# Patient Record
Sex: Female | Born: 1940
Health system: Southern US, Community
[De-identification: ages and names within clinical notes are randomized; demographics above are authoritative.]

## PROBLEM LIST (undated history)

## (undated) DIAGNOSIS — F419 Anxiety disorder, unspecified: Secondary | ICD-10-CM

## (undated) DIAGNOSIS — E7800 Pure hypercholesterolemia, unspecified: Secondary | ICD-10-CM

## (undated) DIAGNOSIS — F32A Depression, unspecified: Secondary | ICD-10-CM

## (undated) DIAGNOSIS — F329 Major depressive disorder, single episode, unspecified: Secondary | ICD-10-CM

## (undated) HISTORY — PX: OTHER SURGICAL HISTORY: SHX169

## (undated) HISTORY — DX: Pure hypercholesterolemia, unspecified: E78.00

## (undated) HISTORY — DX: Anxiety disorder, unspecified: F41.9

## (undated) HISTORY — PX: CHOLECYSTECTOMY: SHX55

## (undated) HISTORY — DX: Depression, unspecified: F32.A

## (undated) HISTORY — DX: Major depressive disorder, single episode, unspecified: F32.9

---

## 2008-07-27 HISTORY — PX: COLONOSCOPY: SHX174

## 2009-08-15 ENCOUNTER — Ambulatory Visit: Payer: Self-pay | Admitting: Cardiology

## 2014-08-13 DIAGNOSIS — N3000 Acute cystitis without hematuria: Secondary | ICD-10-CM | POA: Diagnosis not present

## 2014-08-13 DIAGNOSIS — R109 Unspecified abdominal pain: Secondary | ICD-10-CM | POA: Diagnosis not present

## 2014-08-13 DIAGNOSIS — E780 Pure hypercholesterolemia: Secondary | ICD-10-CM | POA: Diagnosis not present

## 2014-08-13 DIAGNOSIS — F419 Anxiety disorder, unspecified: Secondary | ICD-10-CM | POA: Diagnosis not present

## 2014-09-04 DIAGNOSIS — Z1231 Encounter for screening mammogram for malignant neoplasm of breast: Secondary | ICD-10-CM | POA: Diagnosis not present

## 2014-11-14 DIAGNOSIS — Z1389 Encounter for screening for other disorder: Secondary | ICD-10-CM | POA: Diagnosis not present

## 2014-11-14 DIAGNOSIS — Z23 Encounter for immunization: Secondary | ICD-10-CM | POA: Diagnosis not present

## 2014-11-14 DIAGNOSIS — F419 Anxiety disorder, unspecified: Secondary | ICD-10-CM | POA: Diagnosis not present

## 2014-11-14 DIAGNOSIS — R05 Cough: Secondary | ICD-10-CM | POA: Diagnosis not present

## 2014-11-14 DIAGNOSIS — E780 Pure hypercholesterolemia: Secondary | ICD-10-CM | POA: Diagnosis not present

## 2014-11-14 DIAGNOSIS — F328 Other depressive episodes: Secondary | ICD-10-CM | POA: Diagnosis not present

## 2014-11-14 DIAGNOSIS — Z0001 Encounter for general adult medical examination with abnormal findings: Secondary | ICD-10-CM | POA: Diagnosis not present

## 2014-11-14 DIAGNOSIS — J309 Allergic rhinitis, unspecified: Secondary | ICD-10-CM | POA: Diagnosis not present

## 2015-05-13 DIAGNOSIS — R05 Cough: Secondary | ICD-10-CM | POA: Diagnosis not present

## 2015-05-13 DIAGNOSIS — Z23 Encounter for immunization: Secondary | ICD-10-CM | POA: Diagnosis not present

## 2015-05-13 DIAGNOSIS — J309 Allergic rhinitis, unspecified: Secondary | ICD-10-CM | POA: Diagnosis not present

## 2015-09-09 DIAGNOSIS — Z1231 Encounter for screening mammogram for malignant neoplasm of breast: Secondary | ICD-10-CM | POA: Diagnosis not present

## 2015-10-22 DIAGNOSIS — J209 Acute bronchitis, unspecified: Secondary | ICD-10-CM | POA: Diagnosis not present

## 2015-10-22 DIAGNOSIS — R300 Dysuria: Secondary | ICD-10-CM | POA: Diagnosis not present

## 2015-12-04 DIAGNOSIS — F419 Anxiety disorder, unspecified: Secondary | ICD-10-CM | POA: Diagnosis not present

## 2015-12-04 DIAGNOSIS — E7800 Pure hypercholesterolemia, unspecified: Secondary | ICD-10-CM | POA: Diagnosis not present

## 2015-12-04 DIAGNOSIS — Z13220 Encounter for screening for lipoid disorders: Secondary | ICD-10-CM | POA: Diagnosis not present

## 2015-12-06 DIAGNOSIS — F328 Other depressive episodes: Secondary | ICD-10-CM | POA: Diagnosis not present

## 2015-12-06 DIAGNOSIS — Z0001 Encounter for general adult medical examination with abnormal findings: Secondary | ICD-10-CM | POA: Diagnosis not present

## 2015-12-06 DIAGNOSIS — F419 Anxiety disorder, unspecified: Secondary | ICD-10-CM | POA: Diagnosis not present

## 2015-12-06 DIAGNOSIS — E780 Pure hypercholesterolemia: Secondary | ICD-10-CM | POA: Diagnosis not present

## 2016-01-22 DIAGNOSIS — J309 Allergic rhinitis, unspecified: Secondary | ICD-10-CM | POA: Diagnosis not present

## 2016-01-22 DIAGNOSIS — R05 Cough: Secondary | ICD-10-CM | POA: Diagnosis not present

## 2016-01-22 DIAGNOSIS — J209 Acute bronchitis, unspecified: Secondary | ICD-10-CM | POA: Diagnosis not present

## 2016-04-06 DIAGNOSIS — M1712 Unilateral primary osteoarthritis, left knee: Secondary | ICD-10-CM | POA: Diagnosis not present

## 2016-04-06 DIAGNOSIS — Z6835 Body mass index (BMI) 35.0-35.9, adult: Secondary | ICD-10-CM | POA: Diagnosis not present

## 2016-04-20 DIAGNOSIS — M1712 Unilateral primary osteoarthritis, left knee: Secondary | ICD-10-CM | POA: Diagnosis not present

## 2016-04-20 DIAGNOSIS — M25562 Pain in left knee: Secondary | ICD-10-CM | POA: Diagnosis not present

## 2016-04-20 DIAGNOSIS — Z6834 Body mass index (BMI) 34.0-34.9, adult: Secondary | ICD-10-CM | POA: Diagnosis not present

## 2016-06-04 DIAGNOSIS — F419 Anxiety disorder, unspecified: Secondary | ICD-10-CM | POA: Diagnosis not present

## 2016-06-04 DIAGNOSIS — Z23 Encounter for immunization: Secondary | ICD-10-CM | POA: Diagnosis not present

## 2016-06-04 DIAGNOSIS — J309 Allergic rhinitis, unspecified: Secondary | ICD-10-CM | POA: Diagnosis not present

## 2016-06-04 DIAGNOSIS — E7800 Pure hypercholesterolemia, unspecified: Secondary | ICD-10-CM | POA: Diagnosis not present

## 2016-06-04 DIAGNOSIS — F330 Major depressive disorder, recurrent, mild: Secondary | ICD-10-CM | POA: Diagnosis not present

## 2016-06-04 DIAGNOSIS — Z6834 Body mass index (BMI) 34.0-34.9, adult: Secondary | ICD-10-CM | POA: Diagnosis not present

## 2016-08-11 DIAGNOSIS — R109 Unspecified abdominal pain: Secondary | ICD-10-CM | POA: Diagnosis not present

## 2016-08-11 DIAGNOSIS — M545 Low back pain: Secondary | ICD-10-CM | POA: Diagnosis not present

## 2016-08-11 DIAGNOSIS — Z6834 Body mass index (BMI) 34.0-34.9, adult: Secondary | ICD-10-CM | POA: Diagnosis not present

## 2016-08-11 DIAGNOSIS — F330 Major depressive disorder, recurrent, mild: Secondary | ICD-10-CM | POA: Diagnosis not present

## 2016-09-02 DIAGNOSIS — Z6833 Body mass index (BMI) 33.0-33.9, adult: Secondary | ICD-10-CM | POA: Diagnosis not present

## 2016-09-02 DIAGNOSIS — R109 Unspecified abdominal pain: Secondary | ICD-10-CM | POA: Diagnosis not present

## 2016-09-02 DIAGNOSIS — F419 Anxiety disorder, unspecified: Secondary | ICD-10-CM | POA: Diagnosis not present

## 2016-09-02 DIAGNOSIS — M545 Low back pain: Secondary | ICD-10-CM | POA: Diagnosis not present

## 2016-09-02 DIAGNOSIS — N393 Stress incontinence (female) (male): Secondary | ICD-10-CM | POA: Diagnosis not present

## 2016-09-02 DIAGNOSIS — F330 Major depressive disorder, recurrent, mild: Secondary | ICD-10-CM | POA: Diagnosis not present

## 2016-09-08 DIAGNOSIS — R6889 Other general symptoms and signs: Secondary | ICD-10-CM | POA: Diagnosis not present

## 2016-09-08 DIAGNOSIS — R109 Unspecified abdominal pain: Secondary | ICD-10-CM | POA: Diagnosis not present

## 2016-09-08 DIAGNOSIS — R1031 Right lower quadrant pain: Secondary | ICD-10-CM | POA: Diagnosis not present

## 2016-09-10 DIAGNOSIS — Z1231 Encounter for screening mammogram for malignant neoplasm of breast: Secondary | ICD-10-CM | POA: Diagnosis not present

## 2016-12-14 DIAGNOSIS — E7800 Pure hypercholesterolemia, unspecified: Secondary | ICD-10-CM | POA: Diagnosis not present

## 2016-12-14 DIAGNOSIS — Z0001 Encounter for general adult medical examination with abnormal findings: Secondary | ICD-10-CM | POA: Diagnosis not present

## 2016-12-14 DIAGNOSIS — F419 Anxiety disorder, unspecified: Secondary | ICD-10-CM | POA: Diagnosis not present

## 2016-12-17 DIAGNOSIS — F419 Anxiety disorder, unspecified: Secondary | ICD-10-CM | POA: Diagnosis not present

## 2016-12-17 DIAGNOSIS — F330 Major depressive disorder, recurrent, mild: Secondary | ICD-10-CM | POA: Diagnosis not present

## 2016-12-17 DIAGNOSIS — Z0001 Encounter for general adult medical examination with abnormal findings: Secondary | ICD-10-CM | POA: Diagnosis not present

## 2016-12-17 DIAGNOSIS — E7800 Pure hypercholesterolemia, unspecified: Secondary | ICD-10-CM | POA: Diagnosis not present

## 2016-12-17 DIAGNOSIS — J309 Allergic rhinitis, unspecified: Secondary | ICD-10-CM | POA: Diagnosis not present

## 2016-12-17 DIAGNOSIS — Z6837 Body mass index (BMI) 37.0-37.9, adult: Secondary | ICD-10-CM | POA: Diagnosis not present

## 2017-04-20 DIAGNOSIS — Z23 Encounter for immunization: Secondary | ICD-10-CM | POA: Diagnosis not present

## 2017-05-21 DIAGNOSIS — Z6837 Body mass index (BMI) 37.0-37.9, adult: Secondary | ICD-10-CM | POA: Diagnosis not present

## 2017-05-21 DIAGNOSIS — R109 Unspecified abdominal pain: Secondary | ICD-10-CM | POA: Diagnosis not present

## 2017-05-21 DIAGNOSIS — K219 Gastro-esophageal reflux disease without esophagitis: Secondary | ICD-10-CM | POA: Diagnosis not present

## 2017-06-14 DIAGNOSIS — Z6837 Body mass index (BMI) 37.0-37.9, adult: Secondary | ICD-10-CM | POA: Diagnosis not present

## 2017-06-14 DIAGNOSIS — R109 Unspecified abdominal pain: Secondary | ICD-10-CM | POA: Diagnosis not present

## 2017-06-14 DIAGNOSIS — F419 Anxiety disorder, unspecified: Secondary | ICD-10-CM | POA: Diagnosis not present

## 2017-08-09 ENCOUNTER — Encounter: Payer: Self-pay | Admitting: Internal Medicine

## 2017-09-13 DIAGNOSIS — Z1231 Encounter for screening mammogram for malignant neoplasm of breast: Secondary | ICD-10-CM | POA: Diagnosis not present

## 2017-09-24 ENCOUNTER — Encounter: Payer: Self-pay | Admitting: Gastroenterology

## 2017-09-24 ENCOUNTER — Ambulatory Visit: Payer: Medicare HMO | Admitting: Gastroenterology

## 2017-09-24 DIAGNOSIS — K219 Gastro-esophageal reflux disease without esophagitis: Secondary | ICD-10-CM

## 2017-09-24 DIAGNOSIS — K5900 Constipation, unspecified: Secondary | ICD-10-CM

## 2017-09-24 DIAGNOSIS — R1031 Right lower quadrant pain: Secondary | ICD-10-CM

## 2017-09-24 MED ORDER — PANTOPRAZOLE SODIUM 40 MG PO TBEC
40.0000 mg | DELAYED_RELEASE_TABLET | Freq: Every day | ORAL | 3 refills | Status: DC
Start: 2017-09-24 — End: 2019-04-11

## 2017-09-24 NOTE — Progress Notes (Signed)
Primary Care Physician:  Rory Percy, MD Primary Gastroenterologist:  Dr. Gala Romney   Chief Complaint  Patient presents with  . Abdominal Pain  . Gastroesophageal Reflux  . Constipation  . Bloated    HPI:   Rachael Morgan is a 77 y.o. female presenting today at the request of Dr. Michel Bickers secondary to abdominal pain. LFTs normal, H.pylori serology negative, Hgb 11.7, Hct 34.04 May 2017. CT Feb 2018 at outside facility without explanation for abdominal pain. CBD normal. Pancreas unremarkable.   A year of abdominal pain. RLQ pain. Bloating. Having issues with constipation, gas, and indigestion. Intermittent pain. Stress worsens. Described as an aching pain. Relaxing, sitting down and getting everything off her mind helps to ease discomfort. Constipation for about a year. BM usually about once a day but has to strain. Hard stools. Doesn't feel productive. No rectal bleeding. Quit fried foods. Apples makes her stomach hurt. No weight loss. Loves chocolate. "I'm a chocaholic".   Has indigestion. Nearly about every day. Takes Tums. Solid food dysphagia intermittently. No pill dysphagia.   Last colonoscopy in 2010 in [place]. Normal.    Past Medical History:  Diagnosis Date  . Anxiety and depression   . Hypercholesterolemia     Past Surgical History:  Procedure Laterality Date  . CHOLECYSTECTOMY    . COLONOSCOPY  2010   [place]: normal  . left breast lumpectomy     benign    Current Outpatient Medications  Medication Sig Dispense Refill  . ALPRAZolam (XANAX) 0.25 MG tablet Take 1 tablet by mouth 3 (three) times daily.    Marland Kitchen atorvastatin (LIPITOR) 20 MG tablet Take 1 tablet by mouth daily.  0  . cetirizine (ZYRTEC) 10 MG tablet Take 10 mg by mouth daily.    Marland Kitchen docusate sodium (COLACE) 100 MG capsule Take 100 mg by mouth daily.    . montelukast (SINGULAIR) 10 MG tablet Take 1 tablet by mouth at bedtime.  2  . Probiotic Product (PROBIOTIC DAILY PO) Take by mouth daily.    .  sertraline (ZOLOFT) 100 MG tablet Take 1 tablet by mouth daily.    . pantoprazole (PROTONIX) 40 MG tablet Take 1 tablet (40 mg total) by mouth daily. Take 30 minutes before breakfast daily 90 tablet 3   No current facility-administered medications for this visit.     Allergies as of 09/24/2017 - Review Complete 09/24/2017  Allergen Reaction Noted  . Sulfa antibiotics Nausea Only 09/24/2017    Family History  Problem Relation Age of Onset  . Colon cancer Maternal Grandmother   . Colon polyps Neg Hx     Social History   Socioeconomic History  . Marital status: Married    Spouse name: Not on file  . Number of children: Not on file  . Years of education: Not on file  . Highest education level: Not on file  Social Needs  . Financial resource strain: Not on file  . Food insecurity - worry: Not on file  . Food insecurity - inability: Not on file  . Transportation needs - medical: Not on file  . Transportation needs - non-medical: Not on file  Occupational History  . Not on file  Tobacco Use  . Smoking status: Never Smoker  . Smokeless tobacco: Never Used  Substance and Sexual Activity  . Alcohol use: No    Frequency: Never  . Drug use: No  . Sexual activity: Not on file  Other Topics Concern  . Not on file  Social History Narrative  . Not on file    Review of Systems: Gen: Denies any fever, chills, fatigue, weight loss, lack of appetite.  CV: Denies chest pain, heart palpitations, peripheral edema, syncope.  Resp: Denies shortness of breath at rest or with exertion. Denies wheezing or cough.  GI: see HPI  GU : Denies urinary burning, urinary frequency, urinary hesitancy MS: Denies joint pain, muscle weakness, cramps, or limitation of movement.  Derm: Denies rash, itching, dry skin Psych: see HPI  Heme: Denies bruising, bleeding, and enlarged lymph nodes.  Physical Exam: BP (!) 151/78   Pulse 85   Temp (!) 97 F (36.1 C) (Oral)   Ht 4' 11.5" (1.511 m)   Wt 179  lb (81.2 kg)   BMI 35.55 kg/m  General:   Alert and oriented. Pleasant and cooperative. Well-nourished and well-developed.  Head:  Normocephalic and atraumatic. Eyes:  Without icterus, sclera clear and conjunctiva pink.  Ears:  Normal auditory acuity. Nose:  No deformity, discharge,  or lesions. Mouth:  No deformity or lesions, oral mucosa pink.  Lungs:  Clear to auscultation bilaterally. No wheezes, rales, or rhonchi. No distress.  Heart:  S1, S2 present without murmurs appreciated.  Abdomen:  +BS, soft, non-tender and non-distended. No HSM noted. No guarding or rebound. No masses appreciated.  Rectal:  Deferred  Msk:  Symmetrical without gross deformities. Normal posture. Extremities:  Without  edema. Neurologic:  Alert and  oriented x4 Psych:  Alert and cooperative. Normal mood and affect.

## 2017-09-24 NOTE — Patient Instructions (Addendum)
For reflux/heartburn/indigestion: start taking Protonix once morning, 30 minutes before breakfast. This is best absorbed on an empty stomach.  For constipation: start taking Amitiza 1 gelcap twice a with food to avoid nausea. We can increase this dose if needed, so please let me know how this works for you.   I will see you back in 4-6 weeks!!  It was a pleasure to see you today. I strive to create trusting relationships with patients to provide genuine, compassionate, and quality care. I value your feedback. If you receive a survey regarding your visit,  I greatly appreciate you taking time to fill this out.   Annitta Needs, PhD, ANP-BC [facility]    Food Choices for Gastroesophageal Reflux Disease, Adult When you have gastroesophageal reflux disease (GERD), the foods you eat and your eating habits are very important. Choosing the right foods can help ease your discomfort. What guidelines do I need to follow?  Choose fruits, vegetables, whole grains, and low-fat dairy products.  Choose low-fat meat, fish, and poultry.  Limit fats such as oils, salad dressings, butter, nuts, and avocado.  Keep a food diary. This helps you identify foods that cause symptoms.  Avoid foods that cause symptoms. These may be different for everyone.  Eat small meals often instead of 3 large meals a day.  Eat your meals slowly, in a place where you are relaxed.  Limit fried foods.  Cook foods using methods other than frying.  Avoid drinking alcohol.  Avoid drinking large amounts of liquids with your meals.  Avoid bending over or lying down until 2-3 hours after eating. What foods are not recommended? These are some foods and drinks that may make your symptoms worse: Vegetables Tomatoes. Tomato juice. Tomato and spaghetti sauce. Chili peppers. Onion and garlic. Horseradish. Fruits Oranges, grapefruit, and lemon (fruit and juice). Meats High-fat meats, fish, and poultry. This  includes hot dogs, ribs, ham, sausage, salami, and bacon. Dairy Whole milk and chocolate milk. Sour cream. Cream. Butter. Ice cream. Cream cheese. Drinks Coffee and tea. Bubbly (carbonated) drinks or energy drinks. Condiments Hot sauce. Barbecue sauce. Sweets/Desserts Chocolate and cocoa. Donuts. Peppermint and spearmint. Fats and Oils High-fat foods. This includes [place] fries and potato chips. Other Vinegar. Strong spices. This includes black pepper, white pepper, red pepper, cayenne, curry powder, cloves, ginger, and chili powder. The items listed above may not be a complete list of foods and drinks to avoid. Contact your dietitian for more information. This information is not intended to replace advice given to you by your health care provider. Make sure you discuss any questions you have with your health care provider. Document Released: 01/12/2012 Document Revised: 12/19/2015 Document Reviewed: 05/17/2013 Elsevier Interactive Patient Education  2017 [employer].

## 2017-09-28 ENCOUNTER — Encounter: Payer: Self-pay | Admitting: Gastroenterology

## 2017-09-28 NOTE — Assessment & Plan Note (Signed)
Likely culprit for abdominal pain. Unproductive. CT a year ago for abdominal pain unrevealing. Start Amitiza 8 mcg po BID. Samples provided. May need to adjust dosing. Return in 4-6 weeks.

## 2017-09-28 NOTE — Assessment & Plan Note (Signed)
Intermittent indigestion, no PPI. Solid food dysphagia intermittently. No prior EGD. Start Protonix now. GERD diet/behavior modifications discussed. Return in close follow-up in 4-6 weeks to arrange EGD if continued dysphagia.

## 2017-09-28 NOTE — Assessment & Plan Note (Signed)
Labs unrevealing thus far. CT reassuring done last year due to this pain. No alarm features. Start aggressive bowel regimen. As of note, anxiety worsens discomfort. Return in 4-6 weeks.

## 2017-09-28 NOTE — Progress Notes (Signed)
cc'ed to pcp °

## 2017-09-30 ENCOUNTER — Telehealth: Payer: Self-pay | Admitting: Internal Medicine

## 2017-09-30 NOTE — Telephone Encounter (Signed)
Tried calling pt, mailbox is full °

## 2017-09-30 NOTE — Telephone Encounter (Signed)
That's fine. May leave Amitiza 24 mcg samples.

## 2017-09-30 NOTE — Telephone Encounter (Signed)
Pt called to say she is out of Amitiza 8 mcg samples and asking if we could increase it a little bit and call prescription into [facility]

## 2017-09-30 NOTE — Telephone Encounter (Signed)
Spoke with pt. She is currently taking Amitiza 47mcg. Pt said it works ok but pt still has an urge to go to the bathroom and isn't able to go at time. Pt would like to increase to Amitiza 41mcg. Pt reports no diarrhea.

## 2017-10-01 NOTE — Telephone Encounter (Signed)
Pt notified and will pick samples up.

## 2017-10-25 MED ORDER — LUBIPROSTONE 24 MCG PO CAPS: 24.0000 ug | ORAL_CAPSULE | Freq: Two times a day (BID) | ORAL | 3 refills | Status: DC

## 2017-10-25 NOTE — Addendum Note (Signed)
Addended by: Annitta Needs on: 10/25/2017 12:30 PM   Modules accepted: Orders

## 2017-10-25 NOTE — Telephone Encounter (Signed)
Patient called in letting us know that the amitiza 24 mcg did work for her and is requesting RX to be sent to the pharmacy. Please advise thanks

## 2017-10-25 NOTE — Telephone Encounter (Signed)
Completed.

## 2017-11-08 ENCOUNTER — Ambulatory Visit: Payer: Medicare HMO | Admitting: Gastroenterology

## 2017-11-08 ENCOUNTER — Encounter: Payer: Self-pay | Admitting: Gastroenterology

## 2017-11-08 VITALS — BP 145/77 | HR 83 | Temp 98.1°F | Ht 59.0 in | Wt 177.6 lb

## 2017-11-08 DIAGNOSIS — K5900 Constipation, unspecified: Secondary | ICD-10-CM | POA: Diagnosis not present

## 2017-11-08 DIAGNOSIS — K219 Gastro-esophageal reflux disease without esophagitis: Secondary | ICD-10-CM | POA: Diagnosis not present

## 2017-11-08 NOTE — Progress Notes (Signed)
Primary Care Physician:  Rory Percy, MD  Primary GI: Dr. Gala Romney   Chief Complaint  Patient presents with  . Abdominal Pain    llq    HPI:   Rachael Morgan is a 77 y.o. female presenting today with a history of abdominal pain, CT feb 2018 at outside facility without explanation for abdominal pain. CBD normal, pancreas unremarkable. Present for one year. Noted constipation. No weight loss. Notes chronic indigestion. Vague solid food dysphagia intermittently noted at last visit in March 2019. Last colonoscopy 2010 in [place] normal. Here for follow-up to discuss possible EGD; she was started on a PPI in March 2019 as she had not been on one previously and noted GERD symptoms.    Amitiza 24 mcg worked well but was unable to afford as it was 200$ even with insurance. Pain was improved with bowel regimen. Pain now returned off of bowel agents. No rectal bleeding. No dysphagia. Good appetite.   Past Medical History:  Diagnosis Date  . Anxiety and depression   . Hypercholesterolemia     Past Surgical History:  Procedure Laterality Date  . CHOLECYSTECTOMY    . COLONOSCOPY  2010   [place]: normal  . left breast lumpectomy     benign    Current Outpatient Medications  Medication Sig Dispense Refill  . ALPRAZolam (XANAX) 0.25 MG tablet Take 1 tablet by mouth 3 (three) times daily.    Marland Kitchen atorvastatin (LIPITOR) 20 MG tablet Take 1 tablet by mouth daily.  0  . cetirizine (ZYRTEC) 10 MG tablet Take 10 mg by mouth daily.    . montelukast (SINGULAIR) 10 MG tablet Take 1 tablet by mouth at bedtime.  2  . pantoprazole (PROTONIX) 40 MG tablet Take 1 tablet (40 mg total) by mouth daily. Take 30 minutes before breakfast daily 90 tablet 3  . Probiotic Product (PROBIOTIC DAILY PO) Take by mouth as needed.     . sertraline (ZOLOFT) 100 MG tablet Take 1 tablet by mouth daily.     No current facility-administered medications for this visit.     Allergies as of 11/08/2017 - Review Complete  11/08/2017  Allergen Reaction Noted  . Sulfa antibiotics Nausea Only 09/24/2017    Family History  Problem Relation Age of Onset  . Colon cancer Maternal Grandmother   . Colon polyps Neg Hx     Social History   Socioeconomic History  . Marital status: Married    Spouse name: Not on file  . Number of children: Not on file  . Years of education: Not on file  . Highest education level: Not on file  Occupational History  . Not on file  Social Needs  . Financial resource strain: Not on file  . Food insecurity:    Worry: Not on file    Inability: Not on file  . Transportation needs:    Medical: Not on file    Non-medical: Not on file  Tobacco Use  . Smoking status: Never Smoker  . Smokeless tobacco: Never Used  Substance and Sexual Activity  . Alcohol use: No    Frequency: Never  . Drug use: No  . Sexual activity: Not on file  Lifestyle  . Physical activity:    Days per week: Not on file    Minutes per session: Not on file  . Stress: Not on file  Relationships  . Social connections:    Talks on phone: Not on file    Gets together: Not  on file    Attends religious service: Not on file    Active member of club or organization: Not on file    Attends meetings of clubs or organizations: Not on file    Relationship status: Not on file  Other Topics Concern  . Not on file  Social History Narrative  . Not on file    Review of Systems: Gen: Denies fever, chills, anorexia. Denies fatigue, weakness, weight loss.  CV: Denies chest pain, palpitations, syncope, peripheral edema, and claudication. Resp: Denies dyspnea at rest, cough, wheezing, coughing up blood, and pleurisy. GI: see HPI  Derm: Denies rash, itching, dry skin Psych: Denies depression, anxiety, memory loss, confusion. No homicidal or suicidal ideation.  Heme: Denies bruising, bleeding, and enlarged lymph nodes.  Physical Exam: BP (!) 145/77   Pulse 83   Temp 98.1 F (36.7 C) (Oral)   Ht 4\' 11"  (1.499  m)   Wt 177 lb 9.6 oz (80.6 kg)   BMI 35.87 kg/m  General:   Alert and oriented. No distress noted. Pleasant and cooperative.  Head:  Normocephalic and atraumatic. Eyes:  Conjuctiva clear without scleral icterus. Mouth:  Oral mucosa pink and moist.  Abdomen:  +BS, soft, non-tender and non-distended. No rebound or guarding. No HSM or masses noted. Msk:  Symmetrical without gross deformities. Normal posture. Extremities:  Without edema. Neurologic:  Alert and  oriented x4 Psych:  Alert and cooperative. Normal mood and affect.

## 2017-11-08 NOTE — Patient Instructions (Signed)
I am giving you samples of Linzess 145 microgram capsule. Take this 30 minutes before breakfast each morning (make sure an empty stomach). You may have loose stool for a few days, but it should get better. If not, call me. We could always increase or decrease the dosage.  I will send it to the pharmacy once we decide which medication to do!  Please complete the stool sample. If it is positive, we will pursue a colonoscopy.   It was a pleasure to see you today. I strive to create trusting relationships with patients to provide genuine, compassionate, and quality care. I value your feedback. If you receive a survey regarding your visit,  I greatly appreciate you taking time to fill this out.   Annitta Needs, PhD, ANP-BC [facility]

## 2017-11-10 NOTE — Assessment & Plan Note (Signed)
Noted intermittent GERD and vague dysphagia at last visit but had not been on PPI. Now on Protonix with complete resolution of symptoms. No weight loss. Would pursue EGD if any evidence of dysphagia. Continue PPI therapy for now and dietary measures.

## 2017-11-10 NOTE — Assessment & Plan Note (Signed)
77 year old female with chronic constipation, noting improvement in abdominal pain with improved bowel regimen. Amitiza worked well but unable to afford due to insurance copay. CT on file from last year unrevealing. No concerning signs such as rectal bleeding or weight loss.  It appears Linzess will be covered with her insurance. Provided samples of Linzess 145 mcg daily. Will also obtain ifobt as it has been since 2010 that she had a colonoscopy. Doubt occult process but would pursue colonoscopy if heme positive. Unlikely that updated CT would provide any further information as she has noted significant improvement with bowel regimen.  ifobt now Linzess 145 mcg samples provided Colonoscopy with Dr. Gala Romney if heme positive stool. Discussed risks and benefits with stated understanding. Would need Propofol due to polypharmacy.

## 2017-11-11 DIAGNOSIS — N3001 Acute cystitis with hematuria: Secondary | ICD-10-CM | POA: Diagnosis not present

## 2017-11-11 DIAGNOSIS — K219 Gastro-esophageal reflux disease without esophagitis: Secondary | ICD-10-CM | POA: Diagnosis not present

## 2017-11-11 DIAGNOSIS — Z79899 Other long term (current) drug therapy: Secondary | ICD-10-CM | POA: Diagnosis not present

## 2017-11-11 DIAGNOSIS — R1110 Vomiting, unspecified: Secondary | ICD-10-CM | POA: Diagnosis not present

## 2017-11-11 DIAGNOSIS — R112 Nausea with vomiting, unspecified: Secondary | ICD-10-CM | POA: Diagnosis not present

## 2017-11-11 NOTE — Progress Notes (Signed)
cc'd to pcp 

## 2017-11-23 ENCOUNTER — Ambulatory Visit (INDEPENDENT_AMBULATORY_CARE_PROVIDER_SITE_OTHER): Payer: Medicare HMO | Admitting: Gastroenterology

## 2017-11-23 DIAGNOSIS — Z6841 Body Mass Index (BMI) 40.0 and over, adult: Secondary | ICD-10-CM | POA: Diagnosis not present

## 2017-11-23 DIAGNOSIS — R1084 Generalized abdominal pain: Secondary | ICD-10-CM | POA: Diagnosis not present

## 2017-11-23 DIAGNOSIS — Z1211 Encounter for screening for malignant neoplasm of colon: Secondary | ICD-10-CM | POA: Diagnosis not present

## 2017-11-23 DIAGNOSIS — K219 Gastro-esophageal reflux disease without esophagitis: Secondary | ICD-10-CM | POA: Diagnosis not present

## 2017-11-23 DIAGNOSIS — N390 Urinary tract infection, site not specified: Secondary | ICD-10-CM | POA: Diagnosis not present

## 2017-11-23 LAB — IFOBT (OCCULT BLOOD): IFOBT: NEGATIVE

## 2017-11-23 NOTE — Progress Notes (Signed)
Was she having too much diarrhea with Linzess? Her abdominal discomfort was secondary to constipation. Needs to be careful not on an agent daily as she could have recurrent discomfort.

## 2017-11-23 NOTE — Progress Notes (Signed)
Pt is aware. She said she had to go to ED [facility]) will be 2 weeks ago Friday. She had a UTI and Dr.ordered a CT scan and said it didn't show anything and he took her off of the Linzess. He told her to take a laxative every once in awhile.  She is aware to follow up in 2 months and Erline Levine will send her appt. She will call if she needs something before then.  I offered her an appt tomorrow since we had a couple of cancellations, but she declined and said she will wait and see how she does. Forwarding to Roseanne Kaufman, NP for any advice and to Manuela Schwartz to get reports from ED.

## 2017-11-23 NOTE — Progress Notes (Signed)
ifobt negative. Hopefully, she is doing well with Linzess. Please arrange office visit in 2 months.

## 2017-11-24 ENCOUNTER — Encounter: Payer: Self-pay | Admitting: Internal Medicine

## 2017-11-24 NOTE — Progress Notes (Signed)
PATIENT SCHEDULED AND LETTER SENT  °

## 2017-11-24 NOTE — Progress Notes (Signed)
PT said she had only taken the Linzess for 2 days before she went to the ED. The doctor just told her after she had the CT that it did not show anything so he took her off. She had a UTI, and she said she was wondering if maybe he just meant for her to stop while on the meds for UTI, but she knew she probably needed something for the constipation. I told her since she had only tried for 2 days, to proceed and see how it works for her and let us know. We can always change it if it doesn't work well.  Sending FYI to AB.

## 2017-11-29 ENCOUNTER — Telehealth: Payer: Self-pay | Admitting: Internal Medicine

## 2017-11-29 MED ORDER — LINACLOTIDE 72 MCG PO CAPS: 72.0000 ug | ORAL_CAPSULE | Freq: Every day | ORAL | 5 refills | Status: DC

## 2017-11-29 NOTE — Telephone Encounter (Signed)
Yes, diarrhea with Linzess 145 mcg. Pt would like a RX sent into [facility].

## 2017-11-29 NOTE — Telephone Encounter (Signed)
Pt is taking Linzess 145 mg and is causing her a lot of diarrhea. She was asking if AB could decrease the strength. Please advise and call her at [phone]

## 2017-11-29 NOTE — Telephone Encounter (Signed)
Diarrhea with the 145 mcg? If so, yes can try the 72 mcg. She may have some loose stool for about 4-5 days but should get better. Sounds like she gave it a good effort with the 145. Hopefully the 72 mcg will be better.

## 2017-11-29 NOTE — Telephone Encounter (Signed)
AB spoke with pt, pt is having diarrhea with the Linzess 72 mcg . Pt would like to discuss decreasing the dosage. Pt hasn't tried Linzess 72 mcg. Pt reports no stomach cramping, just diarrhea. Please advise.

## 2017-11-29 NOTE — Telephone Encounter (Signed)
Pt.notified

## 2017-11-29 NOTE — Telephone Encounter (Signed)
Done

## 2017-11-29 NOTE — Addendum Note (Signed)
Addended by: Annitta Needs on: 11/29/2017 03:17 PM   Modules accepted: Orders

## 2017-12-13 DIAGNOSIS — Z1331 Encounter for screening for depression: Secondary | ICD-10-CM | POA: Diagnosis not present

## 2017-12-13 DIAGNOSIS — Z6836 Body mass index (BMI) 36.0-36.9, adult: Secondary | ICD-10-CM | POA: Diagnosis not present

## 2017-12-13 DIAGNOSIS — Z1389 Encounter for screening for other disorder: Secondary | ICD-10-CM | POA: Diagnosis not present

## 2017-12-13 DIAGNOSIS — L247 Irritant contact dermatitis due to plants, except food: Secondary | ICD-10-CM | POA: Diagnosis not present

## 2017-12-24 DIAGNOSIS — Z6837 Body mass index (BMI) 37.0-37.9, adult: Secondary | ICD-10-CM | POA: Diagnosis not present

## 2017-12-24 DIAGNOSIS — N3000 Acute cystitis without hematuria: Secondary | ICD-10-CM | POA: Diagnosis not present

## 2017-12-24 DIAGNOSIS — R300 Dysuria: Secondary | ICD-10-CM | POA: Diagnosis not present

## 2018-01-18 DIAGNOSIS — Z6841 Body Mass Index (BMI) 40.0 and over, adult: Secondary | ICD-10-CM | POA: Diagnosis not present

## 2018-01-18 DIAGNOSIS — E785 Hyperlipidemia, unspecified: Secondary | ICD-10-CM | POA: Diagnosis not present

## 2018-01-18 DIAGNOSIS — Z0000 Encounter for general adult medical examination without abnormal findings: Secondary | ICD-10-CM | POA: Diagnosis not present

## 2018-01-18 DIAGNOSIS — K219 Gastro-esophageal reflux disease without esophagitis: Secondary | ICD-10-CM | POA: Diagnosis not present

## 2018-01-18 DIAGNOSIS — E7800 Pure hypercholesterolemia, unspecified: Secondary | ICD-10-CM | POA: Diagnosis not present

## 2018-01-18 DIAGNOSIS — R1084 Generalized abdominal pain: Secondary | ICD-10-CM | POA: Diagnosis not present

## 2018-01-18 DIAGNOSIS — F419 Anxiety disorder, unspecified: Secondary | ICD-10-CM | POA: Diagnosis not present

## 2018-02-08 DIAGNOSIS — M810 Age-related osteoporosis without current pathological fracture: Secondary | ICD-10-CM | POA: Diagnosis not present

## 2018-02-09 ENCOUNTER — Encounter: Payer: Self-pay | Admitting: Gastroenterology

## 2018-02-09 ENCOUNTER — Ambulatory Visit: Payer: Medicare HMO | Admitting: Gastroenterology

## 2018-02-09 VITALS — BP 136/73 | HR 73 | Temp 97.0°F | Ht <= 58 in | Wt 174.0 lb

## 2018-02-09 DIAGNOSIS — K5900 Constipation, unspecified: Secondary | ICD-10-CM | POA: Diagnosis not present

## 2018-02-09 DIAGNOSIS — K219 Gastro-esophageal reflux disease without esophagitis: Secondary | ICD-10-CM | POA: Diagnosis not present

## 2018-02-09 NOTE — Progress Notes (Signed)
Referring Provider: Rory Percy, MD Primary Care Physician:  Rory Percy, MD  Chief Complaint  Patient presents with  . Gastroesophageal Reflux    occ, but better  . Constipation    taking Linzess qod    HPI:   Rachael Morgan is a 77 y.o. female presenting today with a history of  abdominal pain, CT feb 2018 at outside facility without explanation for abdominal pain. She had a recent CT in interim from last visit at [facility] and tells me this was normal. She was diagnosed with a UTI at that point. Pain improved with bowel regimen. Amitiza too expensive.   Linzess 145 mcg samples provided at last visit, causing some diarrhea. Changed to Linzess 72 mcg. She is recently heme negative. Protonix for GERD.   Taking Linzess 72 mcg every other day. No abdominal pain. BM every day to every other day. No diarrhea. No rectal bleeding. Feels much improved on this regimen. Protonix daily has helped significantly with GERD symptoms. No dysphagia. Doing well overall.    Past Medical History:  Diagnosis Date  . Anxiety and depression   . Hypercholesterolemia     Past Surgical History:  Procedure Laterality Date  . CHOLECYSTECTOMY    . COLONOSCOPY  2010   [place]: normal  . left breast lumpectomy     benign    Current Outpatient Medications  Medication Sig Dispense Refill  . atorvastatin (LIPITOR) 20 MG tablet Take 1 tablet by mouth daily.  0  . cetirizine (ZYRTEC) 10 MG tablet Take 10 mg by mouth daily.    Marland Kitchen linaclotide (LINZESS) 72 MCG capsule Take 1 capsule (72 mcg total) by mouth daily before breakfast. (Patient taking differently: Take 72 mcg by mouth every other day. ) 30 capsule 5  . montelukast (SINGULAIR) 10 MG tablet Take 1 tablet by mouth at bedtime.  2  . pantoprazole (PROTONIX) 40 MG tablet Take 1 tablet (40 mg total) by mouth daily. Take 30 minutes before breakfast daily 90 tablet 3  . Probiotic Product (PROBIOTIC DAILY PO) Take by mouth as needed.     .  sertraline (ZOLOFT) 100 MG tablet Take 1 tablet by mouth daily.     No current facility-administered medications for this visit.     Allergies as of 02/09/2018 - Review Complete 02/09/2018  Allergen Reaction Noted  . Sulfa antibiotics Nausea Only 09/24/2017    Family History  Problem Relation Age of Onset  . Colon cancer Maternal Grandmother   . Colon polyps Neg Hx     Social History   Socioeconomic History  . Marital status: Married    Spouse name: Not on file  . Number of children: Not on file  . Years of education: Not on file  . Highest education level: Not on file  Occupational History  . Not on file  Social Needs  . Financial resource strain: Not on file  . Food insecurity:    Worry: Not on file    Inability: Not on file  . Transportation needs:    Medical: Not on file    Non-medical: Not on file  Tobacco Use  . Smoking status: Never Smoker  . Smokeless tobacco: Never Used  Substance and Sexual Activity  . Alcohol use: No    Frequency: Never  . Drug use: No  . Sexual activity: Not on file  Lifestyle  . Physical activity:    Days per week: Not on file    Minutes per session: Not  on file  . Stress: Not on file  Relationships  . Social connections:    Talks on phone: Not on file    Gets together: Not on file    Attends religious service: Not on file    Active member of club or organization: Not on file    Attends meetings of clubs or organizations: Not on file    Relationship status: Not on file  Other Topics Concern  . Not on file  Social History Narrative  . Not on file    Review of Systems: Gen: Denies fever, chills, anorexia. Denies fatigue, weakness, weight loss.  CV: Denies chest pain, palpitations, syncope, peripheral edema, and claudication. Resp: Denies dyspnea at rest, cough, wheezing, coughing up blood, and pleurisy. GI: see HPI  Derm: Denies rash, itching, dry skin Psych: Denies depression, anxiety, memory loss, confusion. No homicidal  or suicidal ideation.  Heme: Denies bruising, bleeding, and enlarged lymph nodes.  Physical Exam: BP 136/73   Pulse 73   Temp (!) 97 F (36.1 C) (Oral)   Ht 4\' 8"  (1.422 m)   Wt 174 lb (78.9 kg)   BMI 39.01 kg/m  General:   Alert and oriented. No distress noted. Pleasant and cooperative.  Head:  Normocephalic and atraumatic. Eyes:  Conjuctiva clear without scleral icterus. Mouth:  Oral mucosa pink and moist. Abdomen:  +BS, soft, non-tender and non-distended. No rebound or guarding. No HSM or masses noted. Msk:  Symmetrical without gross deformities. Normal posture. Extremities:  Without edema. Neurologic:  Alert and  oriented x4 Psych:  Alert and cooperative. Normal mood and affect.

## 2018-02-09 NOTE — Assessment & Plan Note (Signed)
Doing well on Protonix daily. No dysphagia. Continue once daily dosing.

## 2018-02-09 NOTE — Assessment & Plan Note (Signed)
Significantly improved with Linzess. Best dosage for her has been the 72 mcg, so we will continue this. Routine screening colonoscopy in 2020. Return in Jan 2020 to arrange. Recently heme negative. Will attempt to retrieve the last CT report from [facility] (diagnosed with UTI).

## 2018-02-09 NOTE — Patient Instructions (Addendum)
I am glad you are doing well!  Continue Protonix once each morning, 30 minutes before breakfast. Continue Linzess every other day.  Please call if you need anything!  Have a great summer, fall, and winter! I will see you in Jan 2020!  It was a pleasure to see you today. I strive to create trusting relationships with patients to provide genuine, compassionate, and quality care. I value your feedback. If you receive a survey regarding your visit,  I greatly appreciate you taking time to fill this out.   Annitta Needs, PhD, ANP-BC [facility]

## 2018-02-10 NOTE — Progress Notes (Signed)
cc'ed to pcp °

## 2018-03-29 ENCOUNTER — Telehealth: Payer: Self-pay | Admitting: Internal Medicine

## 2018-03-29 NOTE — Telephone Encounter (Signed)
Spoke with pt she took [place] as directed before eating daily and continued to have diarrhea until she d/c on 03/23/18. Pt is going to try Miralax oct 1 capful and see if this helps her. If Miralax doesn't help, pt will call back.

## 2018-03-29 NOTE — Telephone Encounter (Signed)
Pt is having diarrhea with Linzess 72 mcg after taking it for a few months. Diarrhea continues to worsen for pt with no abdominal cramps. Pt d/c medication on Friday 03/26/18. Pt had diarrhea with Linzess 145 mcg as well. She hasn't tried anything otc to help her bowel move previously when asked. Last bowel movement was 1-2 days ago and it wasn't enough per pt. She didn't report straining.

## 2018-03-29 NOTE — Telephone Encounter (Signed)
Can resume Linzess now and ensure not taking with food, taking it every other day as she was previously doing. If diarrhea recurs, would recommend miralax 1 capful each evening as needed if no BM.

## 2018-03-29 NOTE — Telephone Encounter (Signed)
Pt called asking to speak to nurse about LInzess causing her to have diarrhea. [phone]

## 2018-04-11 DIAGNOSIS — Z6838 Body mass index (BMI) 38.0-38.9, adult: Secondary | ICD-10-CM | POA: Diagnosis not present

## 2018-04-11 DIAGNOSIS — M25552 Pain in left hip: Secondary | ICD-10-CM | POA: Diagnosis not present

## 2018-04-11 DIAGNOSIS — Z23 Encounter for immunization: Secondary | ICD-10-CM | POA: Diagnosis not present

## 2018-04-11 DIAGNOSIS — M1612 Unilateral primary osteoarthritis, left hip: Secondary | ICD-10-CM | POA: Diagnosis not present

## 2018-05-02 ENCOUNTER — Encounter: Payer: Self-pay | Admitting: Internal Medicine

## 2018-05-24 DIAGNOSIS — F419 Anxiety disorder, unspecified: Secondary | ICD-10-CM | POA: Diagnosis not present

## 2018-05-24 DIAGNOSIS — M1612 Unilateral primary osteoarthritis, left hip: Secondary | ICD-10-CM | POA: Diagnosis not present

## 2018-05-24 DIAGNOSIS — K4190 Unilateral femoral hernia, without obstruction or gangrene, not specified as recurrent: Secondary | ICD-10-CM | POA: Diagnosis not present

## 2018-05-24 DIAGNOSIS — M25552 Pain in left hip: Secondary | ICD-10-CM | POA: Diagnosis not present

## 2018-05-24 DIAGNOSIS — Z6837 Body mass index (BMI) 37.0-37.9, adult: Secondary | ICD-10-CM | POA: Diagnosis not present

## 2018-05-30 DIAGNOSIS — R05 Cough: Secondary | ICD-10-CM | POA: Diagnosis not present

## 2018-05-30 DIAGNOSIS — J019 Acute sinusitis, unspecified: Secondary | ICD-10-CM | POA: Diagnosis not present

## 2018-05-30 DIAGNOSIS — Z6837 Body mass index (BMI) 37.0-37.9, adult: Secondary | ICD-10-CM | POA: Diagnosis not present

## 2018-07-07 DIAGNOSIS — M1612 Unilateral primary osteoarthritis, left hip: Secondary | ICD-10-CM | POA: Diagnosis not present

## 2018-07-07 DIAGNOSIS — Z6838 Body mass index (BMI) 38.0-38.9, adult: Secondary | ICD-10-CM | POA: Diagnosis not present

## 2018-07-07 DIAGNOSIS — N189 Chronic kidney disease, unspecified: Secondary | ICD-10-CM | POA: Diagnosis not present

## 2018-07-07 DIAGNOSIS — F419 Anxiety disorder, unspecified: Secondary | ICD-10-CM | POA: Diagnosis not present

## 2018-08-10 ENCOUNTER — Ambulatory Visit: Payer: Medicare HMO | Admitting: Gastroenterology

## 2018-08-10 ENCOUNTER — Encounter: Payer: Self-pay | Admitting: Gastroenterology

## 2018-08-10 ENCOUNTER — Other Ambulatory Visit: Payer: Self-pay | Admitting: *Deleted

## 2018-08-10 ENCOUNTER — Encounter: Payer: Self-pay | Admitting: *Deleted

## 2018-08-10 VITALS — BP 135/89 | HR 95 | Temp 98.6°F | Ht <= 58 in | Wt 172.6 lb

## 2018-08-10 DIAGNOSIS — Z1211 Encounter for screening for malignant neoplasm of colon: Secondary | ICD-10-CM

## 2018-08-10 DIAGNOSIS — K5900 Constipation, unspecified: Secondary | ICD-10-CM | POA: Diagnosis not present

## 2018-08-10 MED ORDER — PEG 3350-KCL-NA BICARB-NACL 420 G PO SOLR: 4000.0000 mL | Freq: Once | ORAL | 0 refills | Status: AC

## 2018-08-10 NOTE — Assessment & Plan Note (Signed)
Very pleasant 78 year old female with history of chronic constipation, unable to take Linzess due to diarrhea and Amitiza not covered under insurance. Due for routine screening colonoscopy and would like to pursue now. Overall remains in good health.   Proceed with TCS with Dr. Gala Romney in near future: the risks, benefits, and alternatives have been discussed with the patient in detail. The patient states understanding and desires to proceed. Propofol due to polypharmacy Start Benefiber daily Increase Miralax to every other day Return in August 2020

## 2018-08-10 NOTE — Patient Instructions (Signed)
You can try taking Miralax one capful every other evening. I would also like for you to start taking Benefiber daily as instructed on the sheet.  We have arranged a colonoscopy with Dr. Gala Romney in the near future.  I will see you again in August 2020!!  I enjoyed seeing you again today! As you know, I value our relationship and want to provide genuine, compassionate, and quality care. I welcome your feedback. If you receive a survey regarding your visit,  I greatly appreciate you taking time to fill this out. See you next time!  Annitta Needs, PhD, ANP-BC [facility]

## 2018-08-10 NOTE — Progress Notes (Signed)
Referring Provider: Rory Percy, MD Primary Care Physician:  Rory Percy, MD  Primary GI: Dr. Gala Romney   Chief Complaint  Patient presents with  . Abdominal Pain    HPI:   Rachael Morgan is a 78 y.o. female presenting today with a history of constipation, GERD, abdominal pain. CTs on file unrevealing (last in [facility] April 2019) . Linzess resulted in diarrhea. Amitiza not covered well under insurance. Heme negative last year. Due for colonoscopy now, routine screening.   Due for routine screening colonoscopy now. Rare abdominal pain related to constipation. Taking Miralax every 4 days or so. One day good BM, the next feels constipated. Dealing with arthritis now and taking Tylenol arthritis. [employer] and working out 3 days a week. Feels better doing this.   Past Medical History:  Diagnosis Date  . Anxiety and depression   . Hypercholesterolemia     Past Surgical History:  Procedure Laterality Date  . CHOLECYSTECTOMY    . COLONOSCOPY  2010   [place]: normal  . left breast lumpectomy     benign    Current Outpatient Medications  Medication Sig Dispense Refill  . atorvastatin (LIPITOR) 20 MG tablet Take 1 tablet by mouth daily.  0  . cetirizine (ZYRTEC) 10 MG tablet Take 10 mg by mouth daily.    . montelukast (SINGULAIR) 10 MG tablet Take 1 tablet by mouth at bedtime.  2  . pantoprazole (PROTONIX) 40 MG tablet Take 1 tablet (40 mg total) by mouth daily. Take 30 minutes before breakfast daily 90 tablet 3  . Probiotic Product (PROBIOTIC DAILY PO) Take by mouth as needed.     . sertraline (ZOLOFT) 100 MG tablet Take 1 tablet by mouth daily.    Marland Kitchen linaclotide (LINZESS) 72 MCG capsule Take 1 capsule (72 mcg total) by mouth daily before breakfast. (Patient not taking: Reported on 08/10/2018) 30 capsule 5   No current facility-administered medications for this visit.     Allergies as of 08/10/2018 - Review Complete 08/10/2018  Allergen Reaction Noted  . Sulfa  antibiotics Nausea Only 09/24/2017    Family History  Problem Relation Age of Onset  . Colon cancer Maternal Grandmother   . Colon polyps Neg Hx     Social History   Socioeconomic History  . Marital status: Married    Spouse name: Not on file  . Number of children: Not on file  . Years of education: Not on file  . Highest education level: Not on file  Occupational History  . Not on file  Social Needs  . Financial resource strain: Not on file  . Food insecurity:    Worry: Not on file    Inability: Not on file  . Transportation needs:    Medical: Not on file    Non-medical: Not on file  Tobacco Use  . Smoking status: Never Smoker  . Smokeless tobacco: Never Used  Substance and Sexual Activity  . Alcohol use: No    Frequency: Never  . Drug use: No  . Sexual activity: Not on file  Lifestyle  . Physical activity:    Days per week: Not on file    Minutes per session: Not on file  . Stress: Not on file  Relationships  . Social connections:    Talks on phone: Not on file    Gets together: Not on file    Attends religious service: Not on file    Active member of club or organization:  Not on file    Attends meetings of clubs or organizations: Not on file    Relationship status: Not on file  Other Topics Concern  . Not on file  Social History Narrative  . Not on file    Review of Systems: Gen: Denies fever, chills, anorexia. Denies fatigue, weakness, weight loss.  CV: Denies chest pain, palpitations, syncope, peripheral edema, and claudication. Resp: Denies dyspnea at rest, cough, wheezing, coughing up blood, and pleurisy. GI: see HPI  Derm: Denies rash, itching, dry skin Psych: Denies depression, anxiety, memory loss, confusion. No homicidal or suicidal ideation.  Heme: Denies bruising, bleeding, and enlarged lymph nodes.  Physical Exam: BP 135/89   Pulse 95   Temp 98.6 F (37 C) (Oral)   Ht 4\' 8"  (1.422 m)   Wt 172 lb 9.6 oz (78.3 kg)   BMI 38.70 kg/m   General:   Alert and oriented. No distress noted. Pleasant and cooperative.  Head:  Normocephalic and atraumatic. Eyes:  Conjuctiva clear without scleral icterus. Mouth:  Oral mucosa pink and moist.  Lungs: clear to auscultation bilaterally Cardiac: S1 S2 present without murmurs  Abdomen:  +BS, soft, non-tender and non-distended. No rebound or guarding. No HSM or masses noted. Msk:  Symmetrical without gross deformities. Normal posture. Extremities:  Without edema. Neurologic:  Alert and  oriented x4 Psych:  Alert and cooperative. Normal mood and affect.

## 2018-08-11 ENCOUNTER — Telehealth: Payer: Self-pay | Admitting: *Deleted

## 2018-08-11 NOTE — Progress Notes (Signed)
CC'D TO PCP °

## 2018-08-11 NOTE — Telephone Encounter (Signed)
Pre-op scheduled for 11/03/2018 at 10:00am. Patient aware. Letter mailed.

## 2018-09-15 DIAGNOSIS — Z1231 Encounter for screening mammogram for malignant neoplasm of breast: Secondary | ICD-10-CM | POA: Diagnosis not present

## 2018-10-20 ENCOUNTER — Telehealth: Payer: Self-pay

## 2018-10-20 NOTE — Telephone Encounter (Addendum)
Pt called office and requested to cancel TCS that was for 11/10/18 d/t COVID-19. She wants to wait until her upcoming OV in September to reschedule. Endo scheduler informed.  FYI to AB.

## 2018-10-21 NOTE — Telephone Encounter (Signed)
Noted  

## 2018-11-03 ENCOUNTER — Other Ambulatory Visit (HOSPITAL_COMMUNITY): Payer: Self-pay

## 2018-11-10 ENCOUNTER — Encounter (HOSPITAL_COMMUNITY): Admission: RE | Payer: Self-pay | Source: Home / Self Care

## 2018-11-10 ENCOUNTER — Ambulatory Visit (HOSPITAL_COMMUNITY): Admission: RE | Admit: 2018-11-10 | Payer: Medicare HMO | Source: Home / Self Care | Admitting: Internal Medicine

## 2018-11-10 SURGERY — COLONOSCOPY WITH PROPOFOL
Anesthesia: Monitor Anesthesia Care

## 2019-01-18 DIAGNOSIS — F419 Anxiety disorder, unspecified: Secondary | ICD-10-CM | POA: Diagnosis not present

## 2019-01-18 DIAGNOSIS — E7800 Pure hypercholesterolemia, unspecified: Secondary | ICD-10-CM | POA: Diagnosis not present

## 2019-01-18 DIAGNOSIS — K219 Gastro-esophageal reflux disease without esophagitis: Secondary | ICD-10-CM | POA: Diagnosis not present

## 2019-01-18 DIAGNOSIS — N189 Chronic kidney disease, unspecified: Secondary | ICD-10-CM | POA: Diagnosis not present

## 2019-01-25 DIAGNOSIS — Z0000 Encounter for general adult medical examination without abnormal findings: Secondary | ICD-10-CM | POA: Diagnosis not present

## 2019-01-25 DIAGNOSIS — N289 Disorder of kidney and ureter, unspecified: Secondary | ICD-10-CM | POA: Diagnosis not present

## 2019-01-25 DIAGNOSIS — J309 Allergic rhinitis, unspecified: Secondary | ICD-10-CM | POA: Diagnosis not present

## 2019-01-25 DIAGNOSIS — Z6836 Body mass index (BMI) 36.0-36.9, adult: Secondary | ICD-10-CM | POA: Diagnosis not present

## 2019-01-25 DIAGNOSIS — F419 Anxiety disorder, unspecified: Secondary | ICD-10-CM | POA: Diagnosis not present

## 2019-02-28 DIAGNOSIS — N289 Disorder of kidney and ureter, unspecified: Secondary | ICD-10-CM | POA: Diagnosis not present

## 2019-04-11 ENCOUNTER — Ambulatory Visit: Payer: Medicare HMO | Admitting: Gastroenterology

## 2019-04-11 ENCOUNTER — Encounter: Payer: Self-pay | Admitting: Gastroenterology

## 2019-04-11 ENCOUNTER — Other Ambulatory Visit: Payer: Self-pay

## 2019-04-11 VITALS — BP 123/78 | HR 80 | Temp 97.0°F | Ht <= 58 in | Wt 167.0 lb

## 2019-04-11 DIAGNOSIS — K219 Gastro-esophageal reflux disease without esophagitis: Secondary | ICD-10-CM

## 2019-04-11 DIAGNOSIS — K5900 Constipation, unspecified: Secondary | ICD-10-CM

## 2019-04-11 MED ORDER — PANTOPRAZOLE SODIUM 40 MG PO TBEC: 40.0000 mg | DELAYED_RELEASE_TABLET | Freq: Every day | ORAL | 3 refills | Status: DC

## 2019-04-11 NOTE — Assessment & Plan Note (Signed)
Resume Protonix once daily. No alarm signs/symptoms. Return in 6 months.

## 2019-04-11 NOTE — Progress Notes (Signed)
Referring Provider: Rory Percy, MD Primary Care Physician:  Rory Percy, MD  Primary GI: Dr. Gala Romney   Chief Complaint  Patient presents with  . Constipation    BM qod, Straining at times, no bleeding  . Abdominal Pain    right side, comes/goes, thinks it is d/t what she eats    HPI:   Rachael Morgan is a 78 y.o. female presenting today with a history of constipation, GERD, abdominal pain. CTs on file unrevealing (last in [facility] April 2019) . Linzess resulted in diarrhea. Amitiza not covered well under insurance. Historically, she has used Miralax. She is due for routine screening colonoscopy.  Her husband passed away (greater than 47 years of marriage) in May 2020. She has postponed colonoscopy and would like to wait for now. Miralax every other day. Occasional RLQ discomfort if upset, or eating chocolate, ice cream. Some improvement after BM. Has a lot of gas. Not taking Protonix but still needing Tums. Occasional straining.     Past Medical History:  Diagnosis Date  . Anxiety and depression   . Hypercholesterolemia     Past Surgical History:  Procedure Laterality Date  . CHOLECYSTECTOMY    . COLONOSCOPY  2010   [place]: normal  . left breast lumpectomy     benign    Current Outpatient Medications  Medication Sig Dispense Refill  . ALPRAZolam (XANAX) 0.25 MG tablet Take 1 tablet by mouth 3 (three) times daily as needed.     Marland Kitchen atorvastatin (LIPITOR) 20 MG tablet Take 1 tablet by mouth daily.  0  . montelukast (SINGULAIR) 10 MG tablet Take 1 tablet by mouth at bedtime.  2  . polyethylene glycol (MIRALAX / GLYCOLAX) 17 g packet Take 17 g by mouth daily as needed.    . sertraline (ZOLOFT) 100 MG tablet Take 1 tablet by mouth daily.    . sertraline (ZOLOFT) 25 MG tablet Take 1 tablet by mouth daily.    . pantoprazole (PROTONIX) 40 MG tablet Take 1 tablet (40 mg total) by mouth daily. Take 30 minutes before breakfast daily. 90 tablet 3   No current  facility-administered medications for this visit.     Allergies as of 04/11/2019 - Review Complete 04/11/2019  Allergen Reaction Noted  . Sulfa antibiotics Nausea Only 09/24/2017    Family History  Problem Relation Age of Onset  . Colon cancer Maternal Grandmother   . Colon polyps Neg Hx     Social History   Socioeconomic History  . Marital status: Married    Spouse name: Not on file  . Number of children: Not on file  . Years of education: Not on file  . Highest education level: Not on file  Occupational History  . Not on file  Social Needs  . Financial resource strain: Not on file  . Food insecurity    Worry: Not on file    Inability: Not on file  . Transportation needs    Medical: Not on file    Non-medical: Not on file  Tobacco Use  . Smoking status: Never Smoker  . Smokeless tobacco: Never Used  Substance and Sexual Activity  . Alcohol use: No    Frequency: Never  . Drug use: No  . Sexual activity: Not on file  Lifestyle  . Physical activity    Days per week: Not on file    Minutes per session: Not on file  . Stress: Not on file  Relationships  .  Social Herbalist on phone: Not on file    Gets together: Not on file    Attends religious service: Not on file    Active member of club or organization: Not on file    Attends meetings of clubs or organizations: Not on file    Relationship status: Not on file  Other Topics Concern  . Not on file  Social History Narrative  . Not on file    Review of Systems: Gen: Denies fever, chills, anorexia. Denies fatigue, weakness, weight loss.  CV: Denies chest pain, palpitations, syncope, peripheral edema, and claudication. Resp: Denies dyspnea at rest, cough, wheezing, coughing up blood, and pleurisy. GI: see HPI Derm: Denies rash, itching, dry skin Psych: Denies depression, anxiety, memory loss, confusion. No homicidal or suicidal ideation.  Heme: Denies bruising, bleeding, and enlarged lymph  nodes.  Physical Exam: BP 123/78   Pulse 80   Temp (!) 97 F (36.1 C) (Oral)   Ht 4\' 8"  (1.422 m)   Wt 167 lb (75.8 kg)   BMI 37.44 kg/m  General:   Alert and oriented. No distress noted. Pleasant and cooperative.  Head:  Normocephalic and atraumatic. Eyes:  Conjuctiva clear without scleral icterus. Abdomen:  +BS, soft, non-tender and non-distended. No rebound or guarding. No HSM or masses noted. Msk:  Symmetrical without gross deformities. Normal posture. Extremities:  Without edema. Neurologic:  Alert and  oriented x4 Psych:  Alert and cooperative. Normal mood and affect.

## 2019-04-11 NOTE — Assessment & Plan Note (Signed)
Improved with Miralax. Add Benefiber to regimen.  Previously tried Linzess, which resulted in diarrhea. Amitiza not covered well under insurance. Return in 6 months. Colonoscopy when patient is ready to pursue.

## 2019-04-11 NOTE — Patient Instructions (Signed)
Start taking Protonix once each morning, 30 minutes before breakfast. This is to help with your indigestion.   I would like for you to try Benefiber 2 teaspoons each day. You can increase this is tolerated. Continue Miralax as needed.  Please let me know if anything changes.   I am so sorry for your loss.  I hope you have a beautiful birthday!  I enjoyed seeing you again today! As you know, I value our relationship and want to provide genuine, compassionate, and quality care. I welcome your feedback. If you receive a survey regarding your visit,  I greatly appreciate you taking time to fill this out. See you next time!  Annitta Needs, PhD, ANP-BC [facility]

## 2019-04-16 NOTE — Progress Notes (Signed)
CC'ED TO PCP 

## 2019-04-26 DIAGNOSIS — I10 Essential (primary) hypertension: Secondary | ICD-10-CM | POA: Diagnosis not present

## 2019-04-26 DIAGNOSIS — E782 Mixed hyperlipidemia: Secondary | ICD-10-CM | POA: Diagnosis not present

## 2019-05-03 DIAGNOSIS — Z23 Encounter for immunization: Secondary | ICD-10-CM | POA: Diagnosis not present

## 2019-07-27 DIAGNOSIS — J019 Acute sinusitis, unspecified: Secondary | ICD-10-CM | POA: Diagnosis not present

## 2019-07-27 DIAGNOSIS — Z6836 Body mass index (BMI) 36.0-36.9, adult: Secondary | ICD-10-CM | POA: Diagnosis not present

## 2019-07-27 DIAGNOSIS — E782 Mixed hyperlipidemia: Secondary | ICD-10-CM | POA: Diagnosis not present

## 2019-07-27 DIAGNOSIS — I10 Essential (primary) hypertension: Secondary | ICD-10-CM | POA: Diagnosis not present

## 2019-09-15 DIAGNOSIS — Z961 Presence of intraocular lens: Secondary | ICD-10-CM | POA: Diagnosis not present

## 2019-09-15 DIAGNOSIS — H524 Presbyopia: Secondary | ICD-10-CM | POA: Diagnosis not present

## 2019-09-15 DIAGNOSIS — H353131 Nonexudative age-related macular degeneration, bilateral, early dry stage: Secondary | ICD-10-CM | POA: Diagnosis not present

## 2019-09-26 DIAGNOSIS — F419 Anxiety disorder, unspecified: Secondary | ICD-10-CM | POA: Diagnosis not present

## 2019-09-26 DIAGNOSIS — Z6836 Body mass index (BMI) 36.0-36.9, adult: Secondary | ICD-10-CM | POA: Diagnosis not present

## 2019-09-26 DIAGNOSIS — J019 Acute sinusitis, unspecified: Secondary | ICD-10-CM | POA: Diagnosis not present

## 2019-10-10 ENCOUNTER — Ambulatory Visit: Payer: Medicare HMO | Admitting: Gastroenterology

## 2019-10-10 ENCOUNTER — Encounter: Payer: Self-pay | Admitting: Gastroenterology

## 2019-10-10 ENCOUNTER — Other Ambulatory Visit: Payer: Self-pay

## 2019-10-10 VITALS — BP 122/80 | HR 92 | Temp 97.7°F | Ht <= 58 in | Wt 167.0 lb

## 2019-10-10 DIAGNOSIS — K219 Gastro-esophageal reflux disease without esophagitis: Secondary | ICD-10-CM

## 2019-10-10 DIAGNOSIS — K5900 Constipation, unspecified: Secondary | ICD-10-CM

## 2019-10-10 NOTE — Patient Instructions (Signed)
I am so glad you are doing well!  We will pursue the cologuard testing.  We will see you in 1 year or sooner if needed!  Happy early birthday!! (I know it will be in September, but I won't see you till after that)   I enjoyed seeing you again today! As you know, I value our relationship and want to provide genuine, compassionate, and quality care. I welcome your feedback. If you receive a survey regarding your visit,  I greatly appreciate you taking time to fill this out. See you next time!  Annitta Needs, PhD, ANP-BC [facility]

## 2019-10-10 NOTE — Progress Notes (Addendum)
Referring Provider: Rory Percy, MD Primary Care Physician:  Rory Percy, MD Primary GI: Dr. Gala Romney   Chief Complaint  Patient presents with  . Gastroesophageal Reflux    f/u. At times will have acid come back up in throat but has improved  . Constipation    right now bowels are good    HPI:   Rachael Morgan is a 79 y.o. female presenting today with a history of constipation, GERD, abdominal pain. CTs on file unrevealing (last in [facility] April 2019). Linzess resulted in diarrhea. Amitiza not covered well under insurance. Historically, she has used Miralax  Presenting today stating she takes Benefiber daily, and Miralax is taken every 3rd day. This has helped provided productive BMs, no straining, and constipation resolved. No overt GI bleeding. Some abdominal discomfort if needs to have a BM. She is not taking PPI any longer. Rare heartburn, for which she will take pepto over the counter. She would like to hold off on colonoscopy but would like to pursue cologuard.   Still struggles with grief and some depression after husband passing last year, but she is doing better than when she was last seen.   Past Medical History:  Diagnosis Date  . Anxiety and depression   . Hypercholesterolemia     Past Surgical History:  Procedure Laterality Date  . CHOLECYSTECTOMY    . COLONOSCOPY  2010   [place]: normal  . left breast lumpectomy     benign    Current Outpatient Medications  Medication Sig Dispense Refill  . ALPRAZolam (XANAX) 0.25 MG tablet Take 1 tablet by mouth 3 (three) times daily as needed.     Marland Kitchen atorvastatin (LIPITOR) 20 MG tablet Take 1 tablet by mouth daily.  0  . bismuth subsalicylate (PEPTO BISMOL) 262 MG chewable tablet Chew 524 mg by mouth as needed.    Marland Kitchen Corn Dextrin (CLEAR FIBER POWDER PO) Take by mouth. 2 tsp daily    . fluticasone (FLONASE) 50 MCG/ACT nasal spray Place 1 spray into both nostrils daily.    Marland Kitchen loratadine (CLARITIN) 10 MG tablet Take  10 mg by mouth daily.    . montelukast (SINGULAIR) 10 MG tablet Take 1 tablet by mouth at bedtime.  2  . PARoxetine (PAXIL) 20 MG tablet Take 20 mg by mouth daily.    . polyethylene glycol (MIRALAX / GLYCOLAX) 17 g packet Take 17 g by mouth. Every 3 days    . sertraline (ZOLOFT) 50 MG tablet Take 1 tablet by mouth daily.      No current facility-administered medications for this visit.    Allergies as of 10/10/2019 - Review Complete 10/10/2019  Allergen Reaction Noted  . Sulfa antibiotics Nausea Only 09/24/2017    Family History  Problem Relation Age of Onset  . Colon cancer Maternal Grandmother   . Colon polyps Neg Hx     Social History   Socioeconomic History  . Marital status: Married    Spouse name: Not on file  . Number of children: Not on file  . Years of education: Not on file  . Highest education level: Not on file  Occupational History  . Not on file  Tobacco Use  . Smoking status: Never Smoker  . Smokeless tobacco: Never Used  Substance and Sexual Activity  . Alcohol use: No  . Drug use: No  . Sexual activity: Not on file  Other Topics Concern  . Not on file  Social History Narrative  .  Not on file   Social Determinants of Health   Financial Resource Strain:   . Difficulty of Paying Living Expenses:   Food Insecurity:   . Worried About Charity fundraiser in the Last Year:   . Arboriculturist in the Last Year:   Transportation Needs:   . Film/video editor (Medical):   Marland Kitchen Lack of Transportation (Non-Medical):   Physical Activity:   . Days of Exercise per Week:   . Minutes of Exercise per Session:   Stress:   . Feeling of Stress :   Social Connections:   . Frequency of Communication with Friends and Family:   . Frequency of Social Gatherings with Friends and Family:   . Attends Religious Services:   . Active Member of Clubs or Organizations:   . Attends Archivist Meetings:   Marland Kitchen Marital Status:     Review of Systems: Gen: Denies  fever, chills, anorexia. Denies fatigue, weakness, weight loss.  CV: Denies chest pain, palpitations, syncope, peripheral edema, and claudication. Resp: Denies dyspnea at rest, cough, wheezing, coughing up blood, and pleurisy. GI: see HPI Derm: Denies rash, itching, dry skin Psych: see HPI Heme: Denies bruising, bleeding, and enlarged lymph nodes.  Physical Exam: BP 122/80   Pulse 92   Temp 97.7 F (36.5 C) (Oral)   Ht 4\' 8"  (1.422 m)   Wt 167 lb (75.8 kg)   BMI 37.44 kg/m  General:   Alert and oriented. No distress noted. Pleasant and cooperative.  Head:  Normocephalic and atraumatic. Eyes:  Conjuctiva clear without scleral icterus. Mouth:  Mask in place  Abdomen:  +BS, soft, non-tender and non-distended. No rebound or guarding. No HSM or masses noted. Msk:  Symmetrical without gross deformities. Normal posture. Extremities:  Without edema. Neurologic:  Alert and  oriented x4 Psych:  Alert and cooperative. Normal mood and affect.  ASSESSMENT: Rachael Morgan is a delightful 79 y.o. female presenting today with history of chronic constipation and occasional GERD. Last colonoscopy approximately 10 years. No alarm signs or symptoms.  Constipation: doing well with supplemental fiber daily and Miralax every 3rd day. Would like to pursue cologuard at this time. We discussed elective screening after the age of 30, and she would like to hold off on colonoscopy but pursue cologuard instead. She is aware that if positive, she will need diagnostic colonoscopy.  GERD: continues to do well with dietary management, taking pepto prn. No alarm signs/symptoms.    PLAN:  Cologuard testing  Continue current bowel regimen   Return in 1 year or sooner if needed  Annitta Needs, PhD, ANP-BC [facility]    Addendum: cologuard positive. Patient desires to pursue colonoscopy. Proceed with TCS with Dr. Gala Romney in near future using Propofol. : the risks, benefits, and  alternatives have been discussed with the patient in detail. The patient states understanding and desires to proceed.  Annitta Needs, PhD, ANP-BC [facility]

## 2019-10-10 NOTE — Progress Notes (Signed)
Cc'ed to pcp °

## 2019-10-17 ENCOUNTER — Encounter: Payer: Self-pay | Admitting: Gastroenterology

## 2019-10-17 DIAGNOSIS — Z1212 Encounter for screening for malignant neoplasm of rectum: Secondary | ICD-10-CM | POA: Diagnosis not present

## 2019-10-17 DIAGNOSIS — Z1211 Encounter for screening for malignant neoplasm of colon: Secondary | ICD-10-CM | POA: Diagnosis not present

## 2019-10-21 LAB — COLOGUARD
COLOGUARD: POSITIVE — AB
Cologuard: POSITIVE — AB

## 2019-10-25 ENCOUNTER — Telehealth: Payer: Self-pay | Admitting: Gastroenterology

## 2019-10-25 DIAGNOSIS — I10 Essential (primary) hypertension: Secondary | ICD-10-CM | POA: Diagnosis not present

## 2019-10-25 DIAGNOSIS — E7849 Other hyperlipidemia: Secondary | ICD-10-CM | POA: Diagnosis not present

## 2019-10-25 NOTE — Telephone Encounter (Signed)
PA approved for TCS via [employer]. Auth# [id number] dates 01/15/2020-02/14/2020

## 2019-10-25 NOTE — Telephone Encounter (Signed)
Called pt, someone answered and then line d/c'd.

## 2019-10-25 NOTE — Telephone Encounter (Signed)
Please let patient know that cologuard testing was positive.   Recommend colonoscopy with Dr. Gala Romney. Needs Propofol.

## 2019-10-25 NOTE — Telephone Encounter (Signed)
Called pt. She is aware of + cologuard. She was agreeable to TCS with propofol with [facility]. She has been scheduled for 6/21 at 8:15am. Patient aware will need pre-op/covid test. Will mail this with her prep instructions. She did not want the "gallon". Patient aware it is currently on back order. Will mail miralax prep instructions to her. She voiced understanding.

## 2019-10-26 ENCOUNTER — Encounter: Payer: Self-pay | Admitting: *Deleted

## 2019-11-24 DIAGNOSIS — E7801 Familial hypercholesterolemia: Secondary | ICD-10-CM | POA: Diagnosis not present

## 2019-11-24 DIAGNOSIS — K219 Gastro-esophageal reflux disease without esophagitis: Secondary | ICD-10-CM | POA: Diagnosis not present

## 2019-11-24 DIAGNOSIS — F419 Anxiety disorder, unspecified: Secondary | ICD-10-CM | POA: Diagnosis not present

## 2019-12-05 DIAGNOSIS — J309 Allergic rhinitis, unspecified: Secondary | ICD-10-CM | POA: Diagnosis not present

## 2019-12-05 DIAGNOSIS — F419 Anxiety disorder, unspecified: Secondary | ICD-10-CM | POA: Diagnosis not present

## 2019-12-05 DIAGNOSIS — Z6836 Body mass index (BMI) 36.0-36.9, adult: Secondary | ICD-10-CM | POA: Diagnosis not present

## 2019-12-09 ENCOUNTER — Ambulatory Visit
Admission: EM | Admit: 2019-12-09 | Discharge: 2019-12-09 | Disposition: A | Discharge status: Home / Self Care | Payer: Medicare HMO | Attending: Emergency Medicine | Admitting: Emergency Medicine

## 2019-12-09 ENCOUNTER — Ambulatory Visit (INDEPENDENT_AMBULATORY_CARE_PROVIDER_SITE_OTHER): Payer: Medicare HMO

## 2019-12-09 DIAGNOSIS — S6992XA Unspecified injury of left wrist, hand and finger(s), initial encounter: Secondary | ICD-10-CM

## 2019-12-09 DIAGNOSIS — M79642 Pain in left hand: Secondary | ICD-10-CM

## 2019-12-09 DIAGNOSIS — S62235A Other nondisplaced fracture of base of first metacarpal bone, left hand, initial encounter for closed fracture: Secondary | ICD-10-CM | POA: Diagnosis not present

## 2019-12-09 DIAGNOSIS — S62232A Other displaced fracture of base of first metacarpal bone, left hand, initial encounter for closed fracture: Secondary | ICD-10-CM | POA: Diagnosis not present

## 2019-12-09 MED ORDER — TRAMADOL HCL 50 MG PO TABS: 50.0000 mg | ORAL_TABLET | Freq: Two times a day (BID) | ORAL | 0 refills | Status: DC | PRN

## 2019-12-09 NOTE — ED Triage Notes (Signed)
Pt hit hand while working on house, no fall. Bruising noted

## 2019-12-09 NOTE — ED Provider Notes (Signed)
[place]   [id number] 12/09/19 Arrival Time: [id number]  CC: LT hand pain/ injury  SUBJECTIVE: History from: patient. Rachael Morgan Rachael Morgan is a 79 y.o. female complains of LT hand pain and injury that began 2 days ago.  Symptoms began after fall from step stool, unsure of mechanism, but hit hand while trying to catch herself.  Localizes the pain to the base of thumb and back of hand.  Describes the pain as intermittent and worse with movement.  Has tried OTC medications without relief.  Symptoms are made worse with ROM.  Denies similar symptoms in the past.  Complains of associated swelling and ecchymosis. Denies fever, chills, erythema, weakness, numbness and tingling.  ROS: As per HPI.  All other pertinent ROS negative.     Past Medical History:  Diagnosis Date  . Anxiety and depression   . Hypercholesterolemia    Past Surgical History:  Procedure Laterality Date  . CHOLECYSTECTOMY    . COLONOSCOPY  2010   [place]: normal  . left breast lumpectomy     benign   Allergies  Allergen Reactions  . Sulfa Antibiotics Nausea Only   No current facility-administered medications on file prior to encounter.   Current Outpatient Medications on File Prior to Encounter  Medication Sig Dispense Refill  . ALPRAZolam (XANAX) 0.25 MG tablet Take 1 tablet by mouth 3 (three) times daily as needed.     Marland Kitchen atorvastatin (LIPITOR) 20 MG tablet Take 1 tablet by mouth daily.  0  . bismuth subsalicylate (PEPTO BISMOL) 262 MG chewable tablet Chew 524 mg by mouth as needed.    Marland Kitchen Corn Dextrin (CLEAR FIBER POWDER PO) Take by mouth. 2 tsp daily    . fluticasone (FLONASE) 50 MCG/ACT nasal spray Place 1 spray into both nostrils daily.    Marland Kitchen loratadine (CLARITIN) 10 MG tablet Take 10 mg by mouth daily.    . montelukast (SINGULAIR) 10 MG tablet Take 1 tablet by mouth at bedtime.  2  . PARoxetine (PAXIL) 20 MG tablet Take 20 mg by mouth daily.    . polyethylene glycol (MIRALAX / GLYCOLAX) 17 g packet  Take 17 g by mouth. Every 3 days    . sertraline (ZOLOFT) 50 MG tablet Take 1 tablet by mouth daily.      Social History   Socioeconomic History  . Marital status: Married    Spouse name: Not on file  . Number of children: Not on file  . Years of education: Not on file  . Highest education level: Not on file  Occupational History  . Not on file  Tobacco Use  . Smoking status: Never Smoker  . Smokeless tobacco: Never Used  Substance and Sexual Activity  . Alcohol use: No  . Drug use: No  . Sexual activity: Not on file  Other Topics Concern  . Not on file  Social History Narrative  . Not on file   Social Determinants of Health   Financial Resource Strain:   . Difficulty of Paying Living Expenses:   Food Insecurity:   . Worried About Charity fundraiser in the Last Year:   . Arboriculturist in the Last Year:   Transportation Needs:   . Film/video editor (Medical):   Marland Kitchen Lack of Transportation (Non-Medical):   Physical Activity:   . Days of Exercise per Week:   . Minutes of Exercise per Session:   Stress:   . Feeling of Stress :   Social Connections:   .  Frequency of Communication with Friends and Family:   . Frequency of Social Gatherings with Friends and Family:   . Attends Religious Services:   . Active Member of Clubs or Organizations:   . Attends Archivist Meetings:   Marland Kitchen Marital Status:   Intimate Partner Violence:   . Fear of Current or Ex-Partner:   . Emotionally Abused:   Marland Kitchen Physically Abused:   . Sexually Abused:    Family History  Problem Relation Age of Onset  . Colon cancer Maternal Grandmother   . Colon polyps Neg Hx     OBJECTIVE:  Vitals:   12/09/19 1301  BP: 126/73  Pulse: 86  Resp: 16  Temp: 97.9 F (36.6 C)  TempSrc: Oral  SpO2: 96%    General appearance: ALERT; in no acute distress.  Head: NCAT Lungs: Normal respiratory effort CV: Radial pulse 2+; cap refill < 2 seconds Musculoskeletal: LT hand Inspection: swelling  and gray ecchymosis diffuse to dorsal aspect of LT hand Palpation: TTP over proximal 1st MC, base of thumb Strength: deferred Skin: warm and dry Neurologic: Ambulates without difficulty; Sensation intact about the upper extremities Psychological: alert and cooperative; normal mood and affect  DIAGNOSTIC STUDIES:  DG Hand Complete Left  Result Date: 12/09/2019 CLINICAL DATA:  Blunt trauma yesterday with hand pain, initial encounter EXAM: LEFT HAND - COMPLETE 3+ VIEW COMPARISON:  None. FINDINGS: There is a fracture at the base of the first metacarpal with mild impaction at the fracture site. No other definitive fracture is seen. Mild soft tissue swelling is noted. IMPRESSION: Fracture at the base of the first metacarpal with mild impaction. No other focal abnormality is noted. Electronically Signed   By: Inez Catalina M.D.   On: 12/09/2019 13:30    X-rays positive for fracture at base of 1st MC  I have reviewed the x-rays myself and the radiologist interpretation. I am in agreement with the radiologist interpretation.     ASSESSMENT & PLAN:  1. Closed nondisplaced fracture of base of first metacarpal bone of left hand, unspecified fracture morphology, initial encounter     Meds ordered this encounter  Medications  . traMADol (ULTRAM) 50 MG tablet    Sig: Take 1 tablet (50 mg total) by mouth every 12 (twelve) hours as needed.    Dispense:  15 tablet    Refill:  0    Order Specific Question:   Supervising Provider    Answer:   Raylene Everts [id number]   X-rays positive for fracture at base of 1st metacarpal, or broken thumb Continue conservative management of rest, ice, and elevate Continue to alternate ibuprofen and/or tylenol Tramadol for severe break-through pain.  DO NOT TAKE prior to driving or operating heavy machinery.  DO NOT TAKE WITH  SSRI it puts you at increased risk of serotonin syndrome, take medications at different times throughout the day to avoid this Follow up  with orthopedist Return or go to the ER if you have any new or worsening symptoms (fever, chills, redness, swelling, bruising, worsening symptoms despite treatment, etc...)    Reviewed expectations re: course of current medical issues. Questions answered. Outlined signs and symptoms indicating need for more acute intervention. Patient verbalized understanding. After Visit Summary given.    Lestine Box, PA-C 12/09/19 1411

## 2019-12-09 NOTE — Discharge Instructions (Addendum)
X-rays positive for fracture at base of 1st metacarpal, or broken thumb Continue conservative management of rest, ice, and elevate Continue to alternate ibuprofen and/or tylenol Tramadol for severe break-through pain.  DO NOT TAKE prior to driving or operating heavy machinery.  DO NOT TAKE WITH  SSRI it puts you at increased risk of serotonin syndrome, take medications at different times throughout the day to avoid this Follow up with orthopedist Return or go to the ER if you have any new or worsening symptoms (fever, chills, redness, swelling, bruising, worsening symptoms despite treatment, etc...)

## 2019-12-11 DIAGNOSIS — S62212D Bennett's fracture, left hand, subsequent encounter for fracture with routine healing: Secondary | ICD-10-CM | POA: Diagnosis not present

## 2019-12-12 DIAGNOSIS — S62232A Other displaced fracture of base of first metacarpal bone, left hand, initial encounter for closed fracture: Secondary | ICD-10-CM | POA: Diagnosis not present

## 2020-01-08 DIAGNOSIS — Z1331 Encounter for screening for depression: Secondary | ICD-10-CM | POA: Diagnosis not present

## 2020-01-08 DIAGNOSIS — F419 Anxiety disorder, unspecified: Secondary | ICD-10-CM | POA: Diagnosis not present

## 2020-01-08 DIAGNOSIS — Z1389 Encounter for screening for other disorder: Secondary | ICD-10-CM | POA: Diagnosis not present

## 2020-01-08 DIAGNOSIS — J309 Allergic rhinitis, unspecified: Secondary | ICD-10-CM | POA: Diagnosis not present

## 2020-01-08 DIAGNOSIS — E7800 Pure hypercholesterolemia, unspecified: Secondary | ICD-10-CM | POA: Diagnosis not present

## 2020-01-08 DIAGNOSIS — Z6836 Body mass index (BMI) 36.0-36.9, adult: Secondary | ICD-10-CM | POA: Diagnosis not present

## 2020-01-09 NOTE — Patient Instructions (Signed)
Shadee Rathod Nimrat Woolworth  01/09/2020     @PREFPERIOPPHARMACY @   Your procedure is scheduled on  01/15/2020 .  Report to [facility] at  [phone]  A.M.  Call this number if you have problems the morning of surgery:  [phone]   Remember:  Follow the diet and prep instructions given to you by Dr Roseanne Kaufman office.                       Take these medicines the morning of surgery with A SIP OF WATER  Xanax(if needed), lexapro.    Do not wear jewelry, make-up or nail polish.  Do not wear lotions, powders, or perfumes. Please wear deodorant and brush your teeth.  Do not shave 48 hours prior to surgery.  Men may shave face and neck.  Do not bring valuables to the hospital.  [facility] is not responsible for any belongings or valuables.  Contacts, dentures or bridgework may not be worn into surgery.  Leave your suitcase in the car.  After surgery it may be brought to your room.  For patients admitted to the hospital, discharge time will be determined by your treatment team.  Patients discharged the day of surgery will not be allowed to drive home.   Name and phone number of your driver:   family Special instructions:  DO NOT smoke the morning of your procedure.  Please read over the following fact sheets that you were given. Anesthesia Post-op Instructions and Care and Recovery After Surgery       Colonoscopy, Adult, Care After This sheet gives you information about how to care for yourself after your procedure. Your health care provider may also give you more specific instructions. If you have problems or questions, contact your health care provider. What can I expect after the procedure? After the procedure, it is common to have:  A small amount of blood in your stool for 24 hours after the procedure.  Some gas.  Mild cramping or bloating of your abdomen. Follow these instructions at home: Eating and drinking   Drink enough fluid to keep your urine pale  yellow.  Follow instructions from your health care provider about eating or drinking restrictions.  Resume your normal diet as instructed by your health care provider. Avoid heavy or fried foods that are hard to digest. Activity  Rest as told by your health care provider.  Avoid sitting for a long time without moving. Get up to take short walks every 1-2 hours. This is important to improve blood flow and breathing. Ask for help if you feel weak or unsteady.  Return to your normal activities as told by your health care provider. Ask your health care provider what activities are safe for you. Managing cramping and bloating   Try walking around when you have cramps or feel bloated.  Apply heat to your abdomen as told by your health care provider. Use the heat source that your health care provider recommends, such as a moist heat pack or a heating pad. ? Place a towel between your skin and the heat source. ? Leave the heat on for 20-30 minutes. ? Remove the heat if your skin turns bright red. This is especially important if you are unable to feel pain, heat, or cold. You may have a greater risk of getting burned. General instructions  For the first 24 hours after the procedure: ? Do not drive or use machinery. ?  Do not sign important documents. ? Do not drink alcohol. ? Do your regular daily activities at a slower pace than normal. ? Eat soft foods that are easy to digest.  Take over-the-counter and prescription medicines only as told by your health care provider.  Keep all follow-up visits as told by your health care provider. This is important. Contact a health care provider if:  You have blood in your stool 2-3 days after the procedure. Get help right away if you have:  More than a small spotting of blood in your stool.  Large blood clots in your stool.  Swelling of your abdomen.  Nausea or vomiting.  A fever.  Increasing pain in your abdomen that is not relieved with  medicine. Summary  After the procedure, it is common to have a small amount of blood in your stool. You may also have mild cramping and bloating of your abdomen.  For the first 24 hours after the procedure, do not drive or use machinery, sign important documents, or drink alcohol.  Get help right away if you have a lot of blood in your stool, nausea or vomiting, a fever, or increased pain in your abdomen. This information is not intended to replace advice given to you by your health care provider. Make sure you discuss any questions you have with your health care provider. Document Revised: 02/06/2019 Document Reviewed: 02/06/2019 Elsevier Patient Education  [place] After These instructions provide you with information about caring for yourself after your procedure. Your health care provider may also give you more specific instructions. Your treatment has been planned according to current medical practices, but problems sometimes occur. Call your health care provider if you have any problems or questions after your procedure. What can I expect after the procedure? After your procedure, you may:  Feel sleepy for several hours.  Feel clumsy and have poor balance for several hours.  Feel forgetful about what happened after the procedure.  Have poor judgment for several hours.  Feel nauseous or vomit.  Have a sore throat if you had a breathing tube during the procedure. Follow these instructions at home: For at least 24 hours after the procedure:      Have a responsible adult stay with you. It is important to have someone help care for you until you are awake and alert.  Rest as needed.  Do not: ? Participate in activities in which you could fall or become injured. ? Drive. ? Use heavy machinery. ? Drink alcohol. ? Take sleeping pills or medicines that cause drowsiness. ? Make important decisions or sign legal documents. ? Take care  of children on your own. Eating and drinking  Follow the diet that is recommended by your health care provider.  If you vomit, drink water, juice, or soup when you can drink without vomiting.  Make sure you have little or no nausea before eating solid foods. General instructions  Take over-the-counter and prescription medicines only as told by your health care provider.  If you have sleep apnea, surgery and certain medicines can increase your risk for breathing problems. Follow instructions from your health care provider about wearing your sleep device: ? Anytime you are sleeping, including during daytime naps. ? While taking prescription pain medicines, sleeping medicines, or medicines that make you drowsy.  If you smoke, do not smoke without supervision.  Keep all follow-up visits as told by your health care provider. This is important. Contact a health  care provider if:  You keep feeling nauseous or you keep vomiting.  You feel light-headed.  You develop a rash.  You have a fever. Get help right away if:  You have trouble breathing. Summary  For several hours after your procedure, you may feel sleepy and have poor judgment.  Have a responsible adult stay with you for at least 24 hours or until you are awake and alert. This information is not intended to replace advice given to you by your health care provider. Make sure you discuss any questions you have with your health care provider. Document Revised: 10/11/2017 Document Reviewed: 11/03/2015 Elsevier Patient Education  [place].

## 2020-01-11 ENCOUNTER — Other Ambulatory Visit: Payer: Self-pay

## 2020-01-11 ENCOUNTER — Other Ambulatory Visit (HOSPITAL_COMMUNITY)
Admission: RE | Admit: 2020-01-11 | Discharge: 2020-01-11 | Disposition: A | Discharge status: Home / Self Care | Payer: Medicare HMO | Source: Ambulatory Visit | Attending: Internal Medicine | Admitting: Internal Medicine

## 2020-01-11 ENCOUNTER — Encounter (HOSPITAL_COMMUNITY)
Admission: RE | Admit: 2020-01-11 | Discharge: 2020-01-11 | Disposition: A | Discharge status: Home / Self Care | Payer: Medicare HMO | Source: Ambulatory Visit | Attending: Internal Medicine | Admitting: Internal Medicine

## 2020-01-11 DIAGNOSIS — Z20822 Contact with and (suspected) exposure to covid-19: Secondary | ICD-10-CM | POA: Diagnosis not present

## 2020-01-11 DIAGNOSIS — Z01812 Encounter for preprocedural laboratory examination: Secondary | ICD-10-CM | POA: Diagnosis not present

## 2020-01-11 LAB — SARS CORONAVIRUS 2 (TAT 6-24 HRS): SARS Coronavirus 2: NEGATIVE

## 2020-01-15 ENCOUNTER — Ambulatory Visit (HOSPITAL_COMMUNITY)
Admission: RE | Admit: 2020-01-15 | Discharge: 2020-01-15 | Disposition: A | Discharge status: Home / Self Care | Payer: Medicare HMO | Attending: Internal Medicine | Admitting: Internal Medicine

## 2020-01-15 ENCOUNTER — Ambulatory Visit (HOSPITAL_COMMUNITY): Payer: Medicare HMO | Admitting: Anesthesiology

## 2020-01-15 ENCOUNTER — Encounter (HOSPITAL_COMMUNITY): Payer: Self-pay | Admitting: Internal Medicine

## 2020-01-15 ENCOUNTER — Other Ambulatory Visit: Payer: Self-pay

## 2020-01-15 ENCOUNTER — Encounter (HOSPITAL_COMMUNITY)
Admission: RE | Disposition: A | Discharge status: Home / Self Care | Payer: Self-pay | Source: Home / Self Care | Attending: Internal Medicine

## 2020-01-15 DIAGNOSIS — R195 Other fecal abnormalities: Secondary | ICD-10-CM | POA: Insufficient documentation

## 2020-01-15 DIAGNOSIS — Z79899 Other long term (current) drug therapy: Secondary | ICD-10-CM | POA: Diagnosis not present

## 2020-01-15 DIAGNOSIS — E7800 Pure hypercholesterolemia, unspecified: Secondary | ICD-10-CM | POA: Insufficient documentation

## 2020-01-15 DIAGNOSIS — F419 Anxiety disorder, unspecified: Secondary | ICD-10-CM | POA: Diagnosis not present

## 2020-01-15 DIAGNOSIS — K5730 Diverticulosis of large intestine without perforation or abscess without bleeding: Secondary | ICD-10-CM | POA: Diagnosis not present

## 2020-01-15 DIAGNOSIS — K219 Gastro-esophageal reflux disease without esophagitis: Secondary | ICD-10-CM | POA: Insufficient documentation

## 2020-01-15 DIAGNOSIS — D124 Benign neoplasm of descending colon: Secondary | ICD-10-CM | POA: Diagnosis not present

## 2020-01-15 DIAGNOSIS — D125 Benign neoplasm of sigmoid colon: Secondary | ICD-10-CM | POA: Insufficient documentation

## 2020-01-15 DIAGNOSIS — K635 Polyp of colon: Secondary | ICD-10-CM

## 2020-01-15 DIAGNOSIS — F329 Major depressive disorder, single episode, unspecified: Secondary | ICD-10-CM | POA: Insufficient documentation

## 2020-01-15 HISTORY — PX: COLONOSCOPY WITH PROPOFOL: SHX5780

## 2020-01-15 HISTORY — PX: POLYPECTOMY: SHX5525

## 2020-01-15 SURGERY — COLONOSCOPY WITH PROPOFOL
Anesthesia: General

## 2020-01-15 MED ORDER — LACTATED RINGERS IV SOLN
INTRAVENOUS | Status: DC | PRN
Start: 2020-01-15 — End: 2020-01-15

## 2020-01-15 MED ORDER — CHLORHEXIDINE GLUCONATE CLOTH 2 % EX PADS: 6.0000 | MEDICATED_PAD | Freq: Once | CUTANEOUS | Status: DC

## 2020-01-15 MED ORDER — PROPOFOL 10 MG/ML IV BOLUS
INTRAVENOUS | Status: DC | PRN
  Administered 2020-01-15: 20 mg via INTRAVENOUS
  Administered 2020-01-15: 60 mg via INTRAVENOUS

## 2020-01-15 MED ORDER — LACTATED RINGERS IV SOLN: Freq: Once | INTRAVENOUS | Status: AC

## 2020-01-15 MED ORDER — STERILE WATER FOR IRRIGATION IR SOLN
Status: DC | PRN
  Administered 2020-01-15: 100 mL

## 2020-01-15 MED ORDER — PROPOFOL 500 MG/50ML IV EMUL
INTRAVENOUS | Status: DC | PRN
  Administered 2020-01-15: 100 ug/kg/min via INTRAVENOUS
  Administered 2020-01-15: 200 ug/kg/min via INTRAVENOUS

## 2020-01-15 NOTE — Anesthesia Procedure Notes (Signed)
Date/Time: 01/15/2020 8:13 AM Performed by: Vista Deck, CRNA Pre-anesthesia Checklist: Patient identified, Emergency Drugs available, Suction available, Timeout performed and Patient being monitored Patient Re-evaluated:Patient Re-evaluated prior to induction Oxygen Delivery Method: Non-rebreather mask

## 2020-01-15 NOTE — Anesthesia Postprocedure Evaluation (Signed)
Anesthesia Post Note  Patient: Rachael Morgan  Procedure(s) Performed: COLONOSCOPY WITH PROPOFOL (N/A ) POLYPECTOMY  Patient location during evaluation: PACU Anesthesia Type: General Level of consciousness: awake and alert and patient cooperative Pain management: satisfactory to patient Vital Signs Assessment: post-procedure vital signs reviewed and stable Respiratory status: spontaneous breathing Cardiovascular status: stable Postop Assessment: no apparent nausea or vomiting Anesthetic complications: no   No complications documented.   Last Vitals:  Vitals:   01/15/20 0726 01/15/20 0845  BP: (!) 126/55 (!) 81/46  Pulse: 79 79  Resp: 17 (!) 22  Temp: 36.4 C 36.6 C  SpO2: 96% 95%    Last Pain:  Vitals:   01/15/20 0845  TempSrc:   PainSc: 0-No pain                 Shilpa Bushee

## 2020-01-15 NOTE — H&P (Addendum)
@LOGO @   Primary Care Physician:  Rory Percy, MD Primary Gastroenterologist:  Dr. Gala Romney  Pre-Procedure History & Physical: HPI:  Rachael Morgan is a 79 y.o. female is here for a screening colonoscopy.  Was seen last year during the pandemic.  Her husband just passed away.  Discussions ongoing for over a year about 1 more screening colonoscopy.  Reported negative examination in 2010.  Patient desirous of 1 more colonoscopy for screening purposes.  However, apparently along the way she ended up having a positive Cologuard.  Past Medical History:  Diagnosis Date   Anxiety and depression    Hypercholesterolemia     Past Surgical History:  Procedure Laterality Date   CHOLECYSTECTOMY     COLONOSCOPY  2010   [place]: normal   left breast lumpectomy     benign    Prior to Admission medications   Medication Sig Start Date End Date Taking? Authorizing Provider  ALPRAZolam (XANAX) 0.25 MG tablet Take 0.25 mg by mouth 3 (three) times daily.  08/08/18  Yes [provider]  atorvastatin (LIPITOR) 20 MG tablet Take 20 mg by mouth daily.  06/29/17  Yes [provider]  Corn Dextrin (CLEAR FIBER POWDER PO) Take 1 Scoop by mouth daily.    Yes [provider]  escitalopram (LEXAPRO) 5 MG tablet Take 5 mg by mouth daily. 12/26/19  Yes [provider]  fluticasone (FLONASE) 50 MCG/ACT nasal spray Place 1 spray into both nostrils daily.   Yes [provider]  montelukast (SINGULAIR) 10 MG tablet Take 10 mg by mouth at bedtime.  09/16/17  Yes [provider]  Multiple Minerals (CALCIUM-MAGNESIUM-ZINC) TABS Take 1 tablet by mouth daily.   Yes [provider]  Omega-3 Fatty Acids (FISH OIL) 1000 MG CAPS Take 1,000 mg by mouth daily.   Yes [provider]  polyethylene glycol (MIRALAX / GLYCOLAX) 17 g packet Take 17 g by mouth every other day.    Yes [provider]  traMADol (ULTRAM) 50 MG tablet Take 1 tablet (50  mg total) by mouth every 12 (twelve) hours as needed. Patient not taking: Reported on 01/03/2020 12/09/19   [place], [place], PA-C    Allergies as of 10/25/2019 - Review Complete 10/10/2019  Allergen Reaction Noted   Sulfa antibiotics Nausea Only 09/24/2017    Family History  Problem Relation Age of Onset   Colon cancer Maternal Grandmother    Colon polyps Neg Hx     Social History   Socioeconomic History   Marital status: Widowed    Spouse name: Not on file   Number of children: Not on file   Years of education: Not on file   Highest education level: Not on file  Occupational History   Not on file  Tobacco Use   Smoking status: Never Smoker   Smokeless tobacco: Never Used  Substance and Sexual Activity   Alcohol use: No   Drug use: No   Sexual activity: Not on file  Other Topics Concern   Not on file  Social History Narrative   Not on file   Social Determinants of Health   Financial Resource Strain:    Difficulty of Paying Living Expenses:   Food Insecurity:    Worried About [place] in the Last Year:    Ran Out of Food in the Last Year:   Transportation Needs:    Lack of Transportation (Medical):    Lack of Transportation (Non-Medical):   Physical Activity:  Days of Exercise per Week:    Minutes of Exercise per Session:   Stress:    Feeling of Stress :   Social Connections:    Frequency of Communication with Friends and Family:    Frequency of Social Gatherings with Friends and Family:    Attends Religious Services:    Active Member of Clubs or Organizations:    Attends Music therapist:    Marital Status:   Intimate Partner Violence:    Fear of Current or Ex-Partner:    Emotionally Abused:    Physically Abused:    Sexually Abused:     Review of Systems: See HPI, otherwise negative ROS  Physical Exam: BP (!) 126/55    Pulse 79    Temp 97.6 F (36.4 C) (Oral)    Resp 17    Ht 4\' 8"  (1.422 m)     Wt 77.1 kg    SpO2 96%    BMI 38.11 kg/m  General:   Alert,  Well-developed, well-nourished, pleasant and cooperative in NAD galy. Lungs:  Clear throughout to auscultation.   No wheezes, crackles, or rhonchi. No acute distress. Heart:  Regular rate and rhythm; no murmurs, clicks, rubs,  or gallops. Abdomen:  Soft, nontender and nondistended. No masses, hepatosplenomegaly or hernias noted. Normal bowel sounds, without guarding, and without rebound.    Impression/Plan: [name] is now here to undergo a diagnostic colonoscopy.  Positive Cologuard Risks, benefits, limitations, imponderables and alternatives regarding colonoscopy have been reviewed with the patient. Questions have been answered. All parties agreeable.     Notice:  This dictation was prepared with Dragon dictation along with smaller phrase technology. Any transcriptional errors that result from this process are unintentional and may not be corrected upon review.

## 2020-01-15 NOTE — Anesthesia Preprocedure Evaluation (Addendum)
Anesthesia Evaluation  Patient identified by MRN, date of birth, ID band Patient awake    Reviewed: Allergy & Precautions, NPO status , Patient's Chart, lab work & pertinent test results  History of Anesthesia Complications Negative for: history of anesthetic complications  Airway Mallampati: II  TM Distance: >3 FB Neck ROM: Full    Dental  (+) Upper Dentures, Missing, Dental Advisory Given   Pulmonary shortness of breath and with exertion,    Pulmonary exam normal breath sounds clear to auscultation       Cardiovascular Exercise Tolerance: Good Normal cardiovascular exam Rhythm:Regular Rate:Normal     Neuro/Psych PSYCHIATRIC DISORDERS Anxiety Depression negative neurological ROS     GI/Hepatic Neg liver ROS, GERD  Controlled,  Endo/Other  negative endocrine ROS  Renal/GU negative Renal ROS  negative genitourinary   Musculoskeletal negative musculoskeletal ROS (+)   Abdominal   Peds  Hematology negative hematology ROS (+)   Anesthesia Other Findings   Reproductive/Obstetrics negative OB ROS                            Anesthesia Physical Anesthesia Plan  ASA: II  Anesthesia Plan: General   Post-op Pain Management:    Induction: Intravenous  PONV Risk Score and Plan: 2  Airway Management Planned: Nasal Cannula, Natural Airway and Simple Face Mask  Additional Equipment:   Intra-op Plan:   Post-operative Plan:   Informed Consent: I have reviewed the patients History and Physical, chart, labs and discussed the procedure including the risks, benefits and alternatives for the proposed anesthesia with the patient or authorized representative who has indicated his/her understanding and acceptance.     Dental advisory given  Plan Discussed with: CRNA and Surgeon  Anesthesia Plan Comments:         Anesthesia Quick Evaluation

## 2020-01-15 NOTE — Discharge Instructions (Signed)
Colonoscopy Discharge Instructions  Read the instructions outlined below and refer to this sheet in the next few weeks. These discharge instructions provide you with general information on caring for yourself after you leave the hospital. Your doctor may also give you specific instructions. While your treatment has been planned according to the most current medical practices available, unavoidable complications occasionally occur. If you have any problems or questions after discharge, call Dr. Gala Romney at [phone]. ACTIVITY  You may resume your regular activity, but move at a slower pace for the next 24 hours.   Take frequent rest periods for the next 24 hours.   Walking will help get rid of the air and reduce the bloated feeling in your belly (abdomen).   No driving for 24 hours (because of the medicine (anesthesia) used during the test).    Do not sign any important legal documents or operate any machinery for 24 hours (because of the anesthesia used during the test).  NUTRITION  Drink plenty of fluids.   You may resume your normal diet as instructed by your doctor.   Begin with a light meal and progress to your normal diet. Heavy or fried foods are harder to digest and may make you feel sick to your stomach (nauseated).   Avoid alcoholic beverages for 24 hours or as instructed.  MEDICATIONS  You may resume your normal medications unless your doctor tells you otherwise.  WHAT YOU CAN EXPECT TODAY  Some feelings of bloating in the abdomen.   Passage of more gas than usual.   Spotting of blood in your stool or on the toilet paper.  IF YOU HAD POLYPS REMOVED DURING THE COLONOSCOPY:  No aspirin products for 7 days or as instructed.   No alcohol for 7 days or as instructed.   Eat a soft diet for the next 24 hours.  FINDING OUT THE RESULTS OF YOUR TEST Not all test results are available during your visit. If your test results are not back during the visit, make an appointment  with your caregiver to find out the results. Do not assume everything is normal if you have not heard from your caregiver or the medical facility. It is important for you to follow up on all of your test results.  SEEK IMMEDIATE MEDICAL ATTENTION IF:  You have more than a spotting of blood in your stool.   Your belly is swollen (abdominal distention).   You are nauseated or vomiting.   You have a temperature over 101.   You have abdominal pain or discomfort that is severe or gets worse throughout the day.   Colon polyp and diverticulosis information provided  Continue all your present medications that you are taking  Further recommendations to follow pending review of pathology report  At patient request, I called Jennier Schissler at [phone] and reviewed results     Colon Polyps  Polyps are tissue growths inside the body. Polyps can grow in many places, including the large intestine (colon). A polyp may be a round bump or a mushroom-shaped growth. You could have one polyp or several. Most colon polyps are noncancerous (benign). However, some colon polyps can become cancerous over time. Finding and removing the polyps early can help prevent this. What are the causes? The exact cause of colon polyps is not known. What increases the risk? You are more likely to develop this condition if you:  Have a family history of colon cancer or colon polyps.  Are older than 50 or  older than 45 if you are African American.  Have inflammatory bowel disease, such as ulcerative colitis or Crohn's disease.  Have certain hereditary conditions, such as: ? Familial adenomatous polyposis. ? Lynch syndrome. ? Turcot syndrome. ? Peutz-Jeghers syndrome.  Are overweight.  Smoke cigarettes.  Do not get enough exercise.  Drink too much alcohol.  Eat a diet that is high in fat and red meat and low in fiber.  Had childhood cancer that was treated with abdominal radiation. What are the signs or  symptoms? Most polyps do not cause symptoms. If you have symptoms, they may include:  Blood coming from your rectum when having a bowel movement.  Blood in your stool. The stool may look dark red or black.  Abdominal pain.  A change in bowel habits, such as constipation or diarrhea. How is this diagnosed? This condition is diagnosed with a colonoscopy. This is a procedure in which a lighted, flexible scope is inserted into the anus and then passed into the colon to examine the area. Polyps are sometimes found when a colonoscopy is done as part of routine cancer screening tests. How is this treated? Treatment for this condition involves removing any polyps that are found. Most polyps can be removed during a colonoscopy. Those polyps will then be tested for cancer. Additional treatment may be needed depending on the results of testing. Follow these instructions at home: Lifestyle  Maintain a healthy weight, or lose weight if recommended by your health care provider.  Exercise every day or as told by your health care provider.  Do not use any products that contain nicotine or tobacco, such as cigarettes and e-cigarettes. If you need help quitting, ask your health care provider.  If you drink alcohol, limit how much you have: ? 0-1 drink a day for women. ? 0-2 drinks a day for men.  Be aware of how much alcohol is in your drink. In the [place] one drink equals one 12 oz bottle of beer (355 mL), one 5 oz glass of wine (148 mL), or one 1 oz shot of hard liquor (44 mL). Eating and drinking   Eat foods that are high in fiber, such as fruits, vegetables, and whole grains.  Eat foods that are high in calcium and vitamin D, such as milk, cheese, yogurt, eggs, liver, fish, and broccoli.  Limit foods that are high in fat, such as fried foods and desserts.  Limit the amount of red meat and processed meat you eat, such as hot dogs, sausage, bacon, and lunch meats. General instructions  Keep  all follow-up visits as told by your health care provider. This is important. ? This includes having regularly scheduled colonoscopies. ? Talk to your health care provider about when you need a colonoscopy. Contact a health care provider if:  You have new or worsening bleeding during a bowel movement.  You have new or increased blood in your stool.  You have a change in bowel habits.  You lose weight for no known reason. Summary  Polyps are tissue growths inside the body. Polyps can grow in many places, including the colon.  Most colon polyps are noncancerous (benign), but some can become cancerous over time.  This condition is diagnosed with a colonoscopy.  Treatment for this condition involves removing any polyps that are found. Most polyps can be removed during a colonoscopy. This information is not intended to replace advice given to you by your health care provider. Make sure you discuss any questions you  have with your health care provider. Document Revised: 10/28/2017 Document Reviewed: 10/28/2017 Elsevier Patient Education  [place].    Diverticulosis  Diverticulosis is a condition that develops when small pouches (diverticula) form in the wall of the large intestine (colon). The colon is where water is absorbed and stool (feces) is formed. The pouches form when the inside layer of the colon pushes through weak spots in the outer layers of the colon. You may have a few pouches or many of them. The pouches usually do not cause problems unless they become inflamed or infected. When this happens, the condition is called diverticulitis. What are the causes? The cause of this condition is not known. What increases the risk? The following factors may make you more likely to develop this condition:  Being older than age 14. Your risk for this condition increases with age. Diverticulosis is rare among people younger than age 68. By age 103, many people have it.  Eating  a low-fiber diet.  Having frequent constipation.  Being overweight.  Not getting enough exercise.  Smoking.  Taking over-the-counter pain medicines, like aspirin and ibuprofen.  Having a family history of diverticulosis. What are the signs or symptoms? In most people, there are no symptoms of this condition. If you do have symptoms, they may include:  Bloating.  Cramps in the abdomen.  Constipation or diarrhea.  Pain in the lower left side of the abdomen. How is this diagnosed? Because diverticulosis usually has no symptoms, it is most often diagnosed during an exam for other colon problems. The condition may be diagnosed by:  Using a flexible scope to examine the colon (colonoscopy).  Taking an X-ray of the colon after dye has been put into the colon (barium enema).  Having a CT scan. How is this treated? You may not need treatment for this condition. Your health care provider may recommend treatment to prevent problems. You may need treatment if you have symptoms or if you previously had diverticulitis. Treatment may include:  Eating a high-fiber diet.  Taking a fiber supplement.  Taking a live bacteria supplement (probiotic).  Taking medicine to relax your colon. Follow these instructions at home: Medicines  Take over-the-counter and prescription medicines only as told by your health care provider.  If told by your health care provider, take a fiber supplement or probiotic. Constipation prevention Your condition may cause constipation. To prevent or treat constipation, you may need to:  Drink enough fluid to keep your urine pale yellow.  Take over-the-counter or prescription medicines.  Eat foods that are high in fiber, such as beans, whole grains, and fresh fruits and vegetables.  Limit foods that are high in fat and processed sugars, such as fried or sweet foods.  General instructions  Try not to strain when you have a bowel movement.  Keep all  follow-up visits as told by your health care provider. This is important. Contact a health care provider if you:  Have pain in your abdomen.  Have bloating.  Have cramps.  Have not had a bowel movement in 3 days. Get help right away if:  Your pain gets worse.  Your bloating becomes very bad.  You have a fever or chills, and your symptoms suddenly get worse.  You vomit.  You have bowel movements that are bloody or black.  You have bleeding from your rectum. Summary  Diverticulosis is a condition that develops when small pouches (diverticula) form in the wall of the large intestine (colon).  You may have a few pouches or many of them.  This condition is most often diagnosed during an exam for other colon problems.  Treatment may include increasing the fiber in your diet, taking supplements, or taking medicines. This information is not intended to replace advice given to you by your health care provider. Make sure you discuss any questions you have with your health care provider. Document Revised: 02/09/2019 Document Reviewed: 02/09/2019 Elsevier Patient Education  [place] After These instructions provide you with information about caring for yourself after your procedure. Your health care provider may also give you more specific instructions. Your treatment has been planned according to current medical practices, but problems sometimes occur. Call your health care provider if you have any problems or questions after your procedure. What can I expect after the procedure? After your procedure, you may:  Feel sleepy for several hours.  Feel clumsy and have poor balance for several hours.  Feel forgetful about what happened after the procedure.  Have poor judgment for several hours.  Feel nauseous or vomit.  Have a sore throat if you had a breathing tube during the procedure. Follow these instructions at home: For at  least 24 hours after the procedure:      Have a responsible adult stay with you. It is important to have someone help care for you until you are awake and alert.  Rest as needed.  Do not: ? Participate in activities in which you could fall or become injured. ? Drive. ? Use heavy machinery. ? Drink alcohol. ? Take sleeping pills or medicines that cause drowsiness. ? Make important decisions or sign legal documents. ? Take care of children on your own. Eating and drinking  Follow the diet that is recommended by your health care provider.  If you vomit, drink water, juice, or soup when you can drink without vomiting.  Make sure you have little or no nausea before eating solid foods. General instructions  Take over-the-counter and prescription medicines only as told by your health care provider.  If you have sleep apnea, surgery and certain medicines can increase your risk for breathing problems. Follow instructions from your health care provider about wearing your sleep device: ? Anytime you are sleeping, including during daytime naps. ? While taking prescription pain medicines, sleeping medicines, or medicines that make you drowsy.  If you smoke, do not smoke without supervision.  Keep all follow-up visits as told by your health care provider. This is important. Contact a health care provider if:  You keep feeling nauseous or you keep vomiting.  You feel light-headed.  You develop a rash.  You have a fever. Get help right away if:  You have trouble breathing. Summary  For several hours after your procedure, you may feel sleepy and have poor judgment.  Have a responsible adult stay with you for at least 24 hours or until you are awake and alert. This information is not intended to replace advice given to you by your health care provider. Make sure you discuss any questions you have with your health care provider. Document Revised: 10/11/2017 Document Reviewed:  11/03/2015 Elsevier Patient Education  [place].

## 2020-01-15 NOTE — Op Note (Signed)
[facility] Patient Name: Rachael Morgan Procedure Date: 01/15/2020 8:00 AM MRN: [id number] Date of Birth: [birth year] Attending MD: Norvel Richards , MD CSN: [id number] Age: 79 Admit Type: Outpatient Procedure:                Colonoscopy Indications:              Positive Cologuard test Providers:                Norvel Richards, MD, Rosina Lowenstein, RN, [place]                            Page Referring MD:              Medicines:                Propofol per Anesthesia Complications:            No immediate complications. Estimated Blood Loss:     Estimated blood loss: none. Estimated blood loss                            was minimal. Procedure:                Pre-Anesthesia Assessment:                           - Prior to the procedure, a History and Physical                            was performed, and patient medications and                            allergies were reviewed. The patient's tolerance of                            previous anesthesia was also reviewed. The risks                            and benefits of the procedure and the sedation                            options and risks were discussed with the patient.                            All questions were answered, and informed consent                            was obtained. Prior Anticoagulants: The patient has                            taken no previous anticoagulant or antiplatelet                            agents. ASA Grade Assessment: II - A patient with                            mild systemic  disease. After reviewing the risks                            and benefits, the patient was deemed in                            satisfactory condition to undergo the procedure.                           After obtaining informed consent, the colonoscope                            was passed under direct vision. Throughout the                            procedure, the patient's blood pressure, pulse, and                             oxygen saturations were monitored continuously. The                            CF-[id number] ([id number]) scope was introduced through                            the anus and advanced to the the cecum, identified                            by appendiceal orifice and ileocecal valve. The                            colonoscopy was performed without difficulty. The                            patient tolerated the procedure well. The quality                            of the bowel preparation was adequate. Scope In: 8:22:39 AM Scope Out: 8:36:30 AM Scope Withdrawal Time: 0 hours 10 minutes 51 seconds  Total Procedure Duration: 0 hours 13 minutes 51 seconds  Findings:      The perianal and digital rectal examinations were normal.      A 2 mm polyp was found in the descending colon. The polyp was sessile.       The polyp was removed with a cold biopsy forceps. Resection and       retrieval were complete. Estimated blood loss was minimal.      A 7 mm polyp was found in the sigmoid colon. The polyp was       semi-pedunculated. The polyp was removed with a cold snare. Resection       and retrieval were complete. Estimated blood loss was minimal.      Scattered medium-mouthed diverticula were found in the sigmoid colon and       descending colon.      The exam was otherwise without abnormality on direct and retroflexion       views. Impression:               -  One 2 mm polyp in the descending colon, removed                            with a cold biopsy forceps. Resected and retrieved.                           - One 7 mm polyp in the sigmoid colon, removed with                            a cold snare. Resected and retrieved.                           - Diverticulosis in the sigmoid colon and in the                            descending colon.                           - The examination was otherwise normal on direct                            and retroflexion views. Moderate  Sedation:      Moderate (conscious) sedation was personally administered by an       anesthesia professional. The following parameters were monitored: oxygen       saturation, heart rate, blood pressure, respiratory rate, EKG, adequacy       of pulmonary ventilation, and response to care. Recommendation:           - Patient has a contact number available for                            emergencies. The signs and symptoms of potential                            delayed complications were discussed with the                            patient. Return to normal activities tomorrow.                            Written discharge instructions were provided to the                            patient.                           - Resume previous diet.                           - Continue present medications.                           - Repeat colonoscopy date to be determined after  pending pathology results are reviewed for                            surveillance.                           - Return to GI office (date not yet determined). Procedure Code(s):        --- Professional ---                           [phone], Colonoscopy, flexible; with removal of                            tumor(s), polyp(s), or other lesion(s) by snare                            technique                           [id number], 63, Colonoscopy, flexible; with biopsy,                            single or multiple Diagnosis Code(s):        --- Professional ---                           K63.5, Polyp of colon                           R19.5, Other fecal abnormalities                           K57.30, Diverticulosis of large intestine without                            perforation or abscess without bleeding CPT copyright 2019 American Medical Association. All rights reserved. The codes documented in this report are preliminary and upon coder review may  be revised to meet current compliance  requirements. Cristopher Estimable. Thatcher Doberstein, MD Norvel Richards, MD 01/15/2020 8:47:01 AM This report has been signed electronically. Number of Addenda: 0

## 2020-01-15 NOTE — Transfer of Care (Signed)
Immediate Anesthesia Transfer of Care Note  Patient: Rachael Morgan  Procedure(s) Performed: COLONOSCOPY WITH PROPOFOL (N/A ) POLYPECTOMY  Patient Location: PACU  Anesthesia Type:General  Level of Consciousness: awake and patient cooperative  Airway & Oxygen Therapy: Patient Spontanous Breathing  Post-op Assessment: Report given to RN and Post -op Vital signs reviewed and stable  Post vital signs: Reviewed and stable  Last Vitals:  Vitals Value Taken Time  BP    Temp 97.8   Pulse 79 01/15/20 0843  Resp 16 01/15/20 0843  SpO2 96 % 01/15/20 0843  Vitals shown include unvalidated device data.  Last Pain:  Vitals:   01/15/20 0838  TempSrc:   PainSc: 0-No pain      Patients Stated Pain Goal: 5 (71/82/09 9068)  Complications: No complications documented.

## 2020-01-16 ENCOUNTER — Encounter: Payer: Self-pay | Admitting: Internal Medicine

## 2020-01-16 DIAGNOSIS — S62232D Other displaced fracture of base of first metacarpal bone, left hand, subsequent encounter for fracture with routine healing: Secondary | ICD-10-CM | POA: Diagnosis not present

## 2020-01-16 LAB — SURGICAL PATHOLOGY

## 2020-01-18 ENCOUNTER — Encounter (HOSPITAL_COMMUNITY): Payer: Self-pay | Admitting: Internal Medicine

## 2020-01-24 DIAGNOSIS — N1831 Chronic kidney disease, stage 3a: Secondary | ICD-10-CM | POA: Diagnosis not present

## 2020-01-24 DIAGNOSIS — M1612 Unilateral primary osteoarthritis, left hip: Secondary | ICD-10-CM | POA: Diagnosis not present

## 2020-01-24 DIAGNOSIS — K219 Gastro-esophageal reflux disease without esophagitis: Secondary | ICD-10-CM | POA: Diagnosis not present

## 2020-01-24 DIAGNOSIS — M1712 Unilateral primary osteoarthritis, left knee: Secondary | ICD-10-CM | POA: Diagnosis not present

## 2020-01-26 DIAGNOSIS — E7800 Pure hypercholesterolemia, unspecified: Secondary | ICD-10-CM | POA: Diagnosis not present

## 2020-01-26 DIAGNOSIS — K219 Gastro-esophageal reflux disease without esophagitis: Secondary | ICD-10-CM | POA: Diagnosis not present

## 2020-01-26 DIAGNOSIS — N189 Chronic kidney disease, unspecified: Secondary | ICD-10-CM | POA: Diagnosis not present

## 2020-01-26 DIAGNOSIS — Z0001 Encounter for general adult medical examination with abnormal findings: Secondary | ICD-10-CM | POA: Diagnosis not present

## 2020-01-26 DIAGNOSIS — Z13220 Encounter for screening for lipoid disorders: Secondary | ICD-10-CM | POA: Diagnosis not present

## 2020-02-01 DIAGNOSIS — Z0000 Encounter for general adult medical examination without abnormal findings: Secondary | ICD-10-CM | POA: Diagnosis not present

## 2020-02-01 DIAGNOSIS — F419 Anxiety disorder, unspecified: Secondary | ICD-10-CM | POA: Diagnosis not present

## 2020-02-01 DIAGNOSIS — Z6836 Body mass index (BMI) 36.0-36.9, adult: Secondary | ICD-10-CM | POA: Diagnosis not present

## 2020-02-01 DIAGNOSIS — N289 Disorder of kidney and ureter, unspecified: Secondary | ICD-10-CM | POA: Diagnosis not present

## 2020-02-01 DIAGNOSIS — Z23 Encounter for immunization: Secondary | ICD-10-CM | POA: Diagnosis not present

## 2020-03-07 DIAGNOSIS — S62232D Other displaced fracture of base of first metacarpal bone, left hand, subsequent encounter for fracture with routine healing: Secondary | ICD-10-CM | POA: Diagnosis not present

## 2020-03-26 DIAGNOSIS — M1712 Unilateral primary osteoarthritis, left knee: Secondary | ICD-10-CM | POA: Diagnosis not present

## 2020-03-26 DIAGNOSIS — M1612 Unilateral primary osteoarthritis, left hip: Secondary | ICD-10-CM | POA: Diagnosis not present

## 2020-03-26 DIAGNOSIS — K219 Gastro-esophageal reflux disease without esophagitis: Secondary | ICD-10-CM | POA: Diagnosis not present

## 2020-03-26 DIAGNOSIS — N1831 Chronic kidney disease, stage 3a: Secondary | ICD-10-CM | POA: Diagnosis not present

## 2020-04-24 DIAGNOSIS — Z23 Encounter for immunization: Secondary | ICD-10-CM | POA: Diagnosis not present

## 2020-04-25 DIAGNOSIS — M1712 Unilateral primary osteoarthritis, left knee: Secondary | ICD-10-CM | POA: Diagnosis not present

## 2020-04-25 DIAGNOSIS — N1831 Chronic kidney disease, stage 3a: Secondary | ICD-10-CM | POA: Diagnosis not present

## 2020-04-25 DIAGNOSIS — M1612 Unilateral primary osteoarthritis, left hip: Secondary | ICD-10-CM | POA: Diagnosis not present

## 2020-05-08 DIAGNOSIS — Z23 Encounter for immunization: Secondary | ICD-10-CM | POA: Diagnosis not present

## 2020-05-25 DIAGNOSIS — M1612 Unilateral primary osteoarthritis, left hip: Secondary | ICD-10-CM | POA: Diagnosis not present

## 2020-05-25 DIAGNOSIS — N1831 Chronic kidney disease, stage 3a: Secondary | ICD-10-CM | POA: Diagnosis not present

## 2020-05-25 DIAGNOSIS — K219 Gastro-esophageal reflux disease without esophagitis: Secondary | ICD-10-CM | POA: Diagnosis not present

## 2020-05-25 DIAGNOSIS — M1712 Unilateral primary osteoarthritis, left knee: Secondary | ICD-10-CM | POA: Diagnosis not present

## 2020-07-08 DIAGNOSIS — F419 Anxiety disorder, unspecified: Secondary | ICD-10-CM | POA: Diagnosis not present

## 2020-07-08 DIAGNOSIS — T7840XA Allergy, unspecified, initial encounter: Secondary | ICD-10-CM | POA: Diagnosis not present

## 2020-07-26 DIAGNOSIS — M1612 Unilateral primary osteoarthritis, left hip: Secondary | ICD-10-CM | POA: Diagnosis not present

## 2020-07-26 DIAGNOSIS — N1831 Chronic kidney disease, stage 3a: Secondary | ICD-10-CM | POA: Diagnosis not present

## 2020-07-26 DIAGNOSIS — M1712 Unilateral primary osteoarthritis, left knee: Secondary | ICD-10-CM | POA: Diagnosis not present

## 2020-08-06 DIAGNOSIS — F4321 Adjustment disorder with depressed mood: Secondary | ICD-10-CM | POA: Diagnosis not present

## 2020-08-06 DIAGNOSIS — Z6837 Body mass index (BMI) 37.0-37.9, adult: Secondary | ICD-10-CM | POA: Diagnosis not present

## 2020-08-06 DIAGNOSIS — F419 Anxiety disorder, unspecified: Secondary | ICD-10-CM | POA: Diagnosis not present

## 2020-08-06 DIAGNOSIS — R251 Tremor, unspecified: Secondary | ICD-10-CM | POA: Diagnosis not present

## 2020-08-24 DIAGNOSIS — K219 Gastro-esophageal reflux disease without esophagitis: Secondary | ICD-10-CM | POA: Diagnosis not present

## 2020-08-24 DIAGNOSIS — N1831 Chronic kidney disease, stage 3a: Secondary | ICD-10-CM | POA: Diagnosis not present

## 2020-08-24 DIAGNOSIS — M1712 Unilateral primary osteoarthritis, left knee: Secondary | ICD-10-CM | POA: Diagnosis not present

## 2020-08-24 DIAGNOSIS — M1612 Unilateral primary osteoarthritis, left hip: Secondary | ICD-10-CM | POA: Diagnosis not present

## 2020-09-06 DIAGNOSIS — F419 Anxiety disorder, unspecified: Secondary | ICD-10-CM | POA: Diagnosis not present

## 2020-09-06 DIAGNOSIS — R251 Tremor, unspecified: Secondary | ICD-10-CM | POA: Diagnosis not present

## 2020-09-06 DIAGNOSIS — J019 Acute sinusitis, unspecified: Secondary | ICD-10-CM | POA: Diagnosis not present

## 2020-09-06 DIAGNOSIS — E119 Type 2 diabetes mellitus without complications: Secondary | ICD-10-CM | POA: Diagnosis not present

## 2020-09-06 DIAGNOSIS — F3289 Other specified depressive episodes: Secondary | ICD-10-CM | POA: Diagnosis not present

## 2020-09-06 DIAGNOSIS — Z6837 Body mass index (BMI) 37.0-37.9, adult: Secondary | ICD-10-CM | POA: Diagnosis not present

## 2020-09-06 DIAGNOSIS — F4321 Adjustment disorder with depressed mood: Secondary | ICD-10-CM | POA: Diagnosis not present

## 2020-09-13 DIAGNOSIS — Z20828 Contact with and (suspected) exposure to other viral communicable diseases: Secondary | ICD-10-CM | POA: Diagnosis not present

## 2020-09-13 DIAGNOSIS — F419 Anxiety disorder, unspecified: Secondary | ICD-10-CM | POA: Diagnosis not present

## 2020-09-13 DIAGNOSIS — Z6837 Body mass index (BMI) 37.0-37.9, adult: Secondary | ICD-10-CM | POA: Diagnosis not present

## 2020-09-19 ENCOUNTER — Encounter: Payer: Self-pay | Admitting: Internal Medicine

## 2020-10-02 DIAGNOSIS — F4321 Adjustment disorder with depressed mood: Secondary | ICD-10-CM | POA: Diagnosis not present

## 2020-10-02 DIAGNOSIS — F439 Reaction to severe stress, unspecified: Secondary | ICD-10-CM | POA: Diagnosis not present

## 2020-10-02 DIAGNOSIS — F419 Anxiety disorder, unspecified: Secondary | ICD-10-CM | POA: Diagnosis not present

## 2020-10-02 DIAGNOSIS — Z6836 Body mass index (BMI) 36.0-36.9, adult: Secondary | ICD-10-CM | POA: Diagnosis not present

## 2020-10-23 DIAGNOSIS — M1712 Unilateral primary osteoarthritis, left knee: Secondary | ICD-10-CM | POA: Diagnosis not present

## 2020-10-23 DIAGNOSIS — K219 Gastro-esophageal reflux disease without esophagitis: Secondary | ICD-10-CM | POA: Diagnosis not present

## 2020-10-23 DIAGNOSIS — N1831 Chronic kidney disease, stage 3a: Secondary | ICD-10-CM | POA: Diagnosis not present

## 2020-10-23 DIAGNOSIS — M1612 Unilateral primary osteoarthritis, left hip: Secondary | ICD-10-CM | POA: Diagnosis not present

## 2020-11-20 DIAGNOSIS — H8110 Benign paroxysmal vertigo, unspecified ear: Secondary | ICD-10-CM | POA: Diagnosis not present

## 2020-11-20 DIAGNOSIS — F419 Anxiety disorder, unspecified: Secondary | ICD-10-CM | POA: Diagnosis not present

## 2020-11-20 DIAGNOSIS — R748 Abnormal levels of other serum enzymes: Secondary | ICD-10-CM | POA: Diagnosis not present

## 2020-11-20 DIAGNOSIS — E785 Hyperlipidemia, unspecified: Secondary | ICD-10-CM | POA: Diagnosis not present

## 2020-11-20 DIAGNOSIS — I700 Atherosclerosis of aorta: Secondary | ICD-10-CM | POA: Diagnosis not present

## 2020-11-20 DIAGNOSIS — R42 Dizziness and giddiness: Secondary | ICD-10-CM | POA: Diagnosis not present

## 2020-11-20 DIAGNOSIS — G4489 Other headache syndrome: Secondary | ICD-10-CM | POA: Diagnosis not present

## 2020-11-20 DIAGNOSIS — R1010 Upper abdominal pain, unspecified: Secondary | ICD-10-CM | POA: Diagnosis not present

## 2020-11-20 DIAGNOSIS — R1110 Vomiting, unspecified: Secondary | ICD-10-CM | POA: Diagnosis not present

## 2020-11-20 DIAGNOSIS — R079 Chest pain, unspecified: Secondary | ICD-10-CM | POA: Diagnosis not present

## 2020-11-20 DIAGNOSIS — R1111 Vomiting without nausea: Secondary | ICD-10-CM | POA: Diagnosis not present

## 2020-11-20 DIAGNOSIS — I10 Essential (primary) hypertension: Secondary | ICD-10-CM | POA: Diagnosis not present

## 2020-11-20 DIAGNOSIS — R109 Unspecified abdominal pain: Secondary | ICD-10-CM | POA: Diagnosis not present

## 2020-11-20 DIAGNOSIS — R110 Nausea: Secondary | ICD-10-CM | POA: Diagnosis not present

## 2020-11-20 DIAGNOSIS — Z882 Allergy status to sulfonamides status: Secondary | ICD-10-CM | POA: Diagnosis not present

## 2020-11-21 DIAGNOSIS — R42 Dizziness and giddiness: Secondary | ICD-10-CM | POA: Diagnosis not present

## 2020-11-21 DIAGNOSIS — R110 Nausea: Secondary | ICD-10-CM | POA: Diagnosis not present

## 2020-11-21 DIAGNOSIS — R079 Chest pain, unspecified: Secondary | ICD-10-CM | POA: Diagnosis not present

## 2020-12-02 DIAGNOSIS — F3289 Other specified depressive episodes: Secondary | ICD-10-CM | POA: Diagnosis not present

## 2020-12-02 DIAGNOSIS — Z23 Encounter for immunization: Secondary | ICD-10-CM | POA: Diagnosis not present

## 2020-12-02 DIAGNOSIS — Z6837 Body mass index (BMI) 37.0-37.9, adult: Secondary | ICD-10-CM | POA: Diagnosis not present

## 2020-12-02 DIAGNOSIS — F419 Anxiety disorder, unspecified: Secondary | ICD-10-CM | POA: Diagnosis not present

## 2020-12-02 DIAGNOSIS — F4321 Adjustment disorder with depressed mood: Secondary | ICD-10-CM | POA: Diagnosis not present

## 2021-01-03 DIAGNOSIS — Z6837 Body mass index (BMI) 37.0-37.9, adult: Secondary | ICD-10-CM | POA: Diagnosis not present

## 2021-01-03 DIAGNOSIS — F4321 Adjustment disorder with depressed mood: Secondary | ICD-10-CM | POA: Diagnosis not present

## 2021-01-03 DIAGNOSIS — F419 Anxiety disorder, unspecified: Secondary | ICD-10-CM | POA: Diagnosis not present

## 2021-01-22 DIAGNOSIS — Z6838 Body mass index (BMI) 38.0-38.9, adult: Secondary | ICD-10-CM | POA: Diagnosis not present

## 2021-01-22 DIAGNOSIS — L259 Unspecified contact dermatitis, unspecified cause: Secondary | ICD-10-CM | POA: Diagnosis not present

## 2021-01-22 DIAGNOSIS — L282 Other prurigo: Secondary | ICD-10-CM | POA: Diagnosis not present

## 2021-01-23 DIAGNOSIS — K219 Gastro-esophageal reflux disease without esophagitis: Secondary | ICD-10-CM | POA: Diagnosis not present

## 2021-01-23 DIAGNOSIS — M1712 Unilateral primary osteoarthritis, left knee: Secondary | ICD-10-CM | POA: Diagnosis not present

## 2021-01-23 DIAGNOSIS — M1612 Unilateral primary osteoarthritis, left hip: Secondary | ICD-10-CM | POA: Diagnosis not present

## 2021-01-23 DIAGNOSIS — N1831 Chronic kidney disease, stage 3a: Secondary | ICD-10-CM | POA: Diagnosis not present

## 2021-02-07 DIAGNOSIS — E7801 Familial hypercholesterolemia: Secondary | ICD-10-CM | POA: Diagnosis not present

## 2021-02-07 DIAGNOSIS — E7800 Pure hypercholesterolemia, unspecified: Secondary | ICD-10-CM | POA: Diagnosis not present

## 2021-02-07 DIAGNOSIS — N189 Chronic kidney disease, unspecified: Secondary | ICD-10-CM | POA: Diagnosis not present

## 2021-02-07 DIAGNOSIS — Z1329 Encounter for screening for other suspected endocrine disorder: Secondary | ICD-10-CM | POA: Diagnosis not present

## 2021-02-07 DIAGNOSIS — K219 Gastro-esophageal reflux disease without esophagitis: Secondary | ICD-10-CM | POA: Diagnosis not present

## 2021-02-11 DIAGNOSIS — Z0001 Encounter for general adult medical examination with abnormal findings: Secondary | ICD-10-CM | POA: Diagnosis not present

## 2021-02-11 DIAGNOSIS — Z6838 Body mass index (BMI) 38.0-38.9, adult: Secondary | ICD-10-CM | POA: Diagnosis not present

## 2021-02-11 DIAGNOSIS — N289 Disorder of kidney and ureter, unspecified: Secondary | ICD-10-CM | POA: Diagnosis not present

## 2021-03-26 DIAGNOSIS — K219 Gastro-esophageal reflux disease without esophagitis: Secondary | ICD-10-CM | POA: Diagnosis not present

## 2021-03-26 DIAGNOSIS — N1831 Chronic kidney disease, stage 3a: Secondary | ICD-10-CM | POA: Diagnosis not present

## 2021-03-26 DIAGNOSIS — M1712 Unilateral primary osteoarthritis, left knee: Secondary | ICD-10-CM | POA: Diagnosis not present

## 2021-03-26 DIAGNOSIS — M1612 Unilateral primary osteoarthritis, left hip: Secondary | ICD-10-CM | POA: Diagnosis not present

## 2021-04-22 DIAGNOSIS — Z20828 Contact with and (suspected) exposure to other viral communicable diseases: Secondary | ICD-10-CM | POA: Diagnosis not present

## 2021-04-22 DIAGNOSIS — J019 Acute sinusitis, unspecified: Secondary | ICD-10-CM | POA: Diagnosis not present

## 2021-04-22 DIAGNOSIS — R059 Cough, unspecified: Secondary | ICD-10-CM | POA: Diagnosis not present

## 2021-06-03 DIAGNOSIS — Z23 Encounter for immunization: Secondary | ICD-10-CM | POA: Diagnosis not present

## 2021-06-14 DIAGNOSIS — J019 Acute sinusitis, unspecified: Secondary | ICD-10-CM | POA: Diagnosis not present

## 2021-06-14 DIAGNOSIS — R059 Cough, unspecified: Secondary | ICD-10-CM | POA: Diagnosis not present

## 2021-06-14 DIAGNOSIS — F419 Anxiety disorder, unspecified: Secondary | ICD-10-CM | POA: Diagnosis not present

## 2021-06-14 DIAGNOSIS — Z20828 Contact with and (suspected) exposure to other viral communicable diseases: Secondary | ICD-10-CM | POA: Diagnosis not present

## 2021-06-25 DIAGNOSIS — M1612 Unilateral primary osteoarthritis, left hip: Secondary | ICD-10-CM | POA: Diagnosis not present

## 2021-06-25 DIAGNOSIS — N1831 Chronic kidney disease, stage 3a: Secondary | ICD-10-CM | POA: Diagnosis not present

## 2021-06-25 DIAGNOSIS — M1712 Unilateral primary osteoarthritis, left knee: Secondary | ICD-10-CM | POA: Diagnosis not present

## 2021-06-25 DIAGNOSIS — K219 Gastro-esophageal reflux disease without esophagitis: Secondary | ICD-10-CM | POA: Diagnosis not present

## 2021-08-04 DIAGNOSIS — Z23 Encounter for immunization: Secondary | ICD-10-CM | POA: Diagnosis not present

## 2021-08-27 DIAGNOSIS — H6123 Impacted cerumen, bilateral: Secondary | ICD-10-CM | POA: Diagnosis not present

## 2021-08-27 DIAGNOSIS — H6121 Impacted cerumen, right ear: Secondary | ICD-10-CM | POA: Diagnosis not present

## 2021-08-27 DIAGNOSIS — N390 Urinary tract infection, site not specified: Secondary | ICD-10-CM | POA: Diagnosis not present

## 2021-08-27 DIAGNOSIS — J309 Allergic rhinitis, unspecified: Secondary | ICD-10-CM | POA: Diagnosis not present

## 2021-09-02 DIAGNOSIS — Z1231 Encounter for screening mammogram for malignant neoplasm of breast: Secondary | ICD-10-CM | POA: Diagnosis not present

## 2021-10-24 DIAGNOSIS — N189 Chronic kidney disease, unspecified: Secondary | ICD-10-CM | POA: Diagnosis not present

## 2021-10-24 DIAGNOSIS — E782 Mixed hyperlipidemia: Secondary | ICD-10-CM | POA: Diagnosis not present

## 2021-10-24 DIAGNOSIS — I10 Essential (primary) hypertension: Secondary | ICD-10-CM | POA: Diagnosis not present

## 2021-10-24 DIAGNOSIS — E7800 Pure hypercholesterolemia, unspecified: Secondary | ICD-10-CM | POA: Diagnosis not present

## 2021-10-30 ENCOUNTER — Encounter: Payer: Self-pay | Admitting: Gastroenterology

## 2021-10-30 ENCOUNTER — Ambulatory Visit: Payer: Medicare HMO | Admitting: Gastroenterology

## 2021-10-30 VITALS — BP 120/72 | HR 76 | Temp 97.5°F | Ht <= 58 in | Wt 175.0 lb

## 2021-10-30 DIAGNOSIS — K5900 Constipation, unspecified: Secondary | ICD-10-CM | POA: Diagnosis not present

## 2021-10-30 NOTE — Progress Notes (Signed)
? ? ? ? ? ?Gastroenterology Office Note   ? ? ?Primary Care Physician:  Rory Percy, MD  ?Primary Gastroenterologist: Dr. Gala Romney  ? ? ?Chief Complaint  ? ?Chief Complaint  ?Patient presents with  ? Constipation  ?  Thinks it may be due to starting paroxetine.   ? ? ? ?History of Present Illness  ? ?Rachael Morgan is an 81 y.o. female presenting today in follow-up with a history of  history of constipation and GERD. Colonoscopy in 2021 due to positive Cologuard with one 2 mm polyp, one 7 mm polyp, diverticulosis. Tubular adenomas.  ? ?Constipation: historically, Linzess caused diarrhea. Amitiza not covered well under insurance. Miralax has been used along with fiber in the past.  ? ? ?She has had worsening constipation for past 2 weeks. Has a dulcolax stool softener. Not taken routinely yet as wanted to talk about it first. Taking Benefiber 2 teaspoons once per day. Miralax cramps her stomach. No rectal bleeding. Started paroxetine recently.  ? ?Still grieving over loss of husband (2021). She finds anxiety is heightened.  ? ? ? ? ? ? ?Past Medical History:  ?Diagnosis Date  ? Anxiety and depression   ? Hypercholesterolemia   ? ? ?Past Surgical History:  ?Procedure Laterality Date  ? CHOLECYSTECTOMY    ? COLONOSCOPY  2010  ? [place]: normal  ? COLONOSCOPY WITH PROPOFOL N/A 01/15/2020  ? due to positive Cologuard with one 2 mm polyp, one 7 mm polyp, diverticulosis. Tubular adenomas.  ? left breast lumpectomy    ? benign  ? POLYPECTOMY  01/15/2020  ? Procedure: POLYPECTOMY;  Surgeon: Daneil Dolin, MD;  [facility];  Service: Endoscopy;;  ? ? ?Current Outpatient Medications  ?Medication Sig Dispense Refill  ? atorvastatin (LIPITOR) 20 MG tablet Take 20 mg by mouth daily.    ? docusate sodium (COLACE) 100 MG capsule Take 100 mg by mouth daily as needed for mild constipation.    ? fexofenadine (ALLEGRA) 180 MG tablet Take 180 mg by mouth daily as needed for allergies or rhinitis.    ? fluticasone  (FLONASE) 50 MCG/ACT nasal spray Place 1 spray into both nostrils daily.    ? montelukast (SINGULAIR) 10 MG tablet Take 10 mg by mouth daily.    ? Multiple Minerals (CALCIUM-MAGNESIUM-ZINC) TABS Take by mouth.    ? PARoxetine (PAXIL) 30 MG tablet Take 30 mg by mouth daily.    ? propranolol (INDERAL) 10 MG tablet Take 10 mg by mouth daily.    ? Wheat Dextrin (BENEFIBER DRINK MIX PO) Take 4 g by mouth daily.    ? ?No current facility-administered medications for this visit.  ? ? ?Allergies as of 10/30/2021 - Review Complete 10/30/2021  ?Allergen Reaction Noted  ? Sulfa antibiotics Nausea Only 09/24/2017  ? ? ?Family History  ?Problem Relation Age of Onset  ? Colon cancer Maternal Grandmother   ? Colon polyps Neg Hx   ? ? ?Social History  ? ?Socioeconomic History  ? Marital status: Widowed  ?  Spouse name: Not on file  ? Number of children: Not on file  ? Years of education: Not on file  ? Highest education level: Not on file  ?Occupational History  ? Not on file  ?Tobacco Use  ? Smoking status: Never  ? Smokeless tobacco: Never  ?Substance and Sexual Activity  ? Alcohol use: No  ? Drug use: No  ? Sexual activity: Not Currently  ?Other Topics Concern  ? Not  on file  ?Social History Narrative  ? Not on file  ? ?Social Determinants of Health  ? ?Financial Resource Strain: Not on file  ?Food Insecurity: Not on file  ?Transportation Needs: Not on file  ?Physical Activity: Not on file  ?Stress: Not on file  ?Social Connections: Not on file  ?Intimate Partner Violence: Not on file  ? ? ? ?Review of Systems  ? ?Gen: Denies any fever, chills, fatigue, weight loss, lack of appetite.  ?CV: Denies chest pain, heart palpitations, peripheral edema, syncope.  ?Resp: Denies shortness of breath at rest or with exertion. Denies wheezing or cough.  ?GI see HPI ?GU : Denies urinary burning, urinary frequency, urinary hesitancy ?MS: Denies joint pain, muscle weakness, cramps, or limitation of movement.  ?Derm: Denies rash, itching, dry  skin ?Psych: Denies depression, anxiety, memory loss, and confusion ?Heme: Denies bruising, bleeding, and enlarged lymph nodes. ? ? ?Physical Exam  ? ?BP 120/72 (BP Location: Right Arm, Patient Position: Sitting, Cuff Size: Normal)   Pulse 76   Temp (!) 97.5 ?F (36.4 ?C) (Temporal)   Ht '4\' 8"'$  (1.422 m)   Wt 175 lb (79.4 kg)   SpO2 97%   BMI 39.23 kg/m?  ?General:   Alert and oriented. Pleasant and cooperative. Well-nourished and well-developed.  ?Head:  Normocephalic and atraumatic. ?Eyes:  Without icterus ?Abdomen:  +BS, soft, non-tender and non-distended. No HSM noted. No guarding or rebound. No masses appreciated.  ?Rectal:  Deferred  ?Msk:  Symmetrical without gross deformities. Normal posture. ?Extremities:  Without edema. ?Neurologic:  Alert and  oriented x4;  grossly normal neurologically. ?Skin:  Intact without significant lesions or rashes. ?Psych:  Alert and cooperative. Normal mood and affect. ? ? ?Assessment  ? ?Rachael Morgan is a delightful 81 y.o. female presenting today in follow-up with a history of GERD and constipation, now reporting constipation has worsened over past few weeks. ? ?We will have her continue Benefiber daily but increase to twice a day. She will then add the stool softener once to twice a day as needed after giving fiber a few days. Miralax has caused cramping in the past. No alarm signs/symptoms.  ? ? ?PLAN  ? ?Increase fiber ?Add dulcolax stool softener daily to BID if needed ?Return in 3 months ?Call with update ? ? ?Annitta Needs, PhD, ANP-BC ?[facility]  ? ? ?

## 2021-10-30 NOTE — Patient Instructions (Signed)
Let's increase the fiber to twice a day. 2 teaspoons twice a day. Give this a few days, then you can add the dulcolax stool softener once to twice a day as needed. ? ?Please let me know how you are doing in about a week! ? ?Ezekiel bread may be a fun option for you to try. It is is the freezer section. ? ?I will see you in 3 months! ? ?I enjoyed seeing you again today! As you know, I value our relationship and want to provide genuine, compassionate, and quality care. I welcome your feedback. If you receive a survey regarding your visit,  I greatly appreciate you taking time to fill this out. See you next time! ? ?Annitta Needs, PhD, ANP-BC ?[facility]  ? ?

## 2021-11-16 DIAGNOSIS — R4182 Altered mental status, unspecified: Secondary | ICD-10-CM | POA: Diagnosis not present

## 2021-11-16 DIAGNOSIS — T50905A Adverse effect of unspecified drugs, medicaments and biological substances, initial encounter: Secondary | ICD-10-CM | POA: Diagnosis not present

## 2021-11-16 DIAGNOSIS — R251 Tremor, unspecified: Secondary | ICD-10-CM | POA: Diagnosis not present

## 2021-11-16 DIAGNOSIS — Z882 Allergy status to sulfonamides status: Secondary | ICD-10-CM | POA: Diagnosis not present

## 2021-11-17 DIAGNOSIS — I10 Essential (primary) hypertension: Secondary | ICD-10-CM | POA: Diagnosis not present

## 2021-11-17 DIAGNOSIS — F13939 Sedative, hypnotic or anxiolytic use, unspecified with withdrawal, unspecified: Secondary | ICD-10-CM | POA: Diagnosis not present

## 2021-11-17 DIAGNOSIS — F419 Anxiety disorder, unspecified: Secondary | ICD-10-CM | POA: Diagnosis not present

## 2021-11-17 DIAGNOSIS — Z6837 Body mass index (BMI) 37.0-37.9, adult: Secondary | ICD-10-CM | POA: Diagnosis not present

## 2021-11-23 DIAGNOSIS — I10 Essential (primary) hypertension: Secondary | ICD-10-CM | POA: Diagnosis not present

## 2021-11-23 DIAGNOSIS — E782 Mixed hyperlipidemia: Secondary | ICD-10-CM | POA: Diagnosis not present

## 2021-11-23 DIAGNOSIS — E7800 Pure hypercholesterolemia, unspecified: Secondary | ICD-10-CM | POA: Diagnosis not present

## 2021-11-23 DIAGNOSIS — N189 Chronic kidney disease, unspecified: Secondary | ICD-10-CM | POA: Diagnosis not present

## 2021-12-03 DIAGNOSIS — R4582 Worries: Secondary | ICD-10-CM | POA: Diagnosis not present

## 2021-12-03 DIAGNOSIS — E7849 Other hyperlipidemia: Secondary | ICD-10-CM | POA: Diagnosis not present

## 2021-12-03 DIAGNOSIS — F331 Major depressive disorder, recurrent, moderate: Secondary | ICD-10-CM | POA: Diagnosis not present

## 2021-12-03 DIAGNOSIS — J301 Allergic rhinitis due to pollen: Secondary | ICD-10-CM | POA: Diagnosis not present

## 2021-12-03 DIAGNOSIS — Z6836 Body mass index (BMI) 36.0-36.9, adult: Secondary | ICD-10-CM | POA: Diagnosis not present

## 2021-12-17 DIAGNOSIS — F331 Major depressive disorder, recurrent, moderate: Secondary | ICD-10-CM | POA: Diagnosis not present

## 2021-12-17 DIAGNOSIS — F419 Anxiety disorder, unspecified: Secondary | ICD-10-CM | POA: Diagnosis not present

## 2021-12-24 DIAGNOSIS — D631 Anemia in chronic kidney disease: Secondary | ICD-10-CM | POA: Diagnosis not present

## 2021-12-24 DIAGNOSIS — I10 Essential (primary) hypertension: Secondary | ICD-10-CM | POA: Diagnosis not present

## 2021-12-24 DIAGNOSIS — E7800 Pure hypercholesterolemia, unspecified: Secondary | ICD-10-CM | POA: Diagnosis not present

## 2021-12-24 DIAGNOSIS — E782 Mixed hyperlipidemia: Secondary | ICD-10-CM | POA: Diagnosis not present

## 2021-12-24 DIAGNOSIS — N189 Chronic kidney disease, unspecified: Secondary | ICD-10-CM | POA: Diagnosis not present

## 2021-12-24 IMAGING — DX DG HAND COMPLETE 3+V*L*
3 series · 3 of 3 positions shown · non-contrast
Comparison: None.

CLINICAL DATA: Blunt trauma yesterday with hand pain, initial
encounter

EXAM:
LEFT HAND - COMPLETE 3+ VIEW

[hand pa]
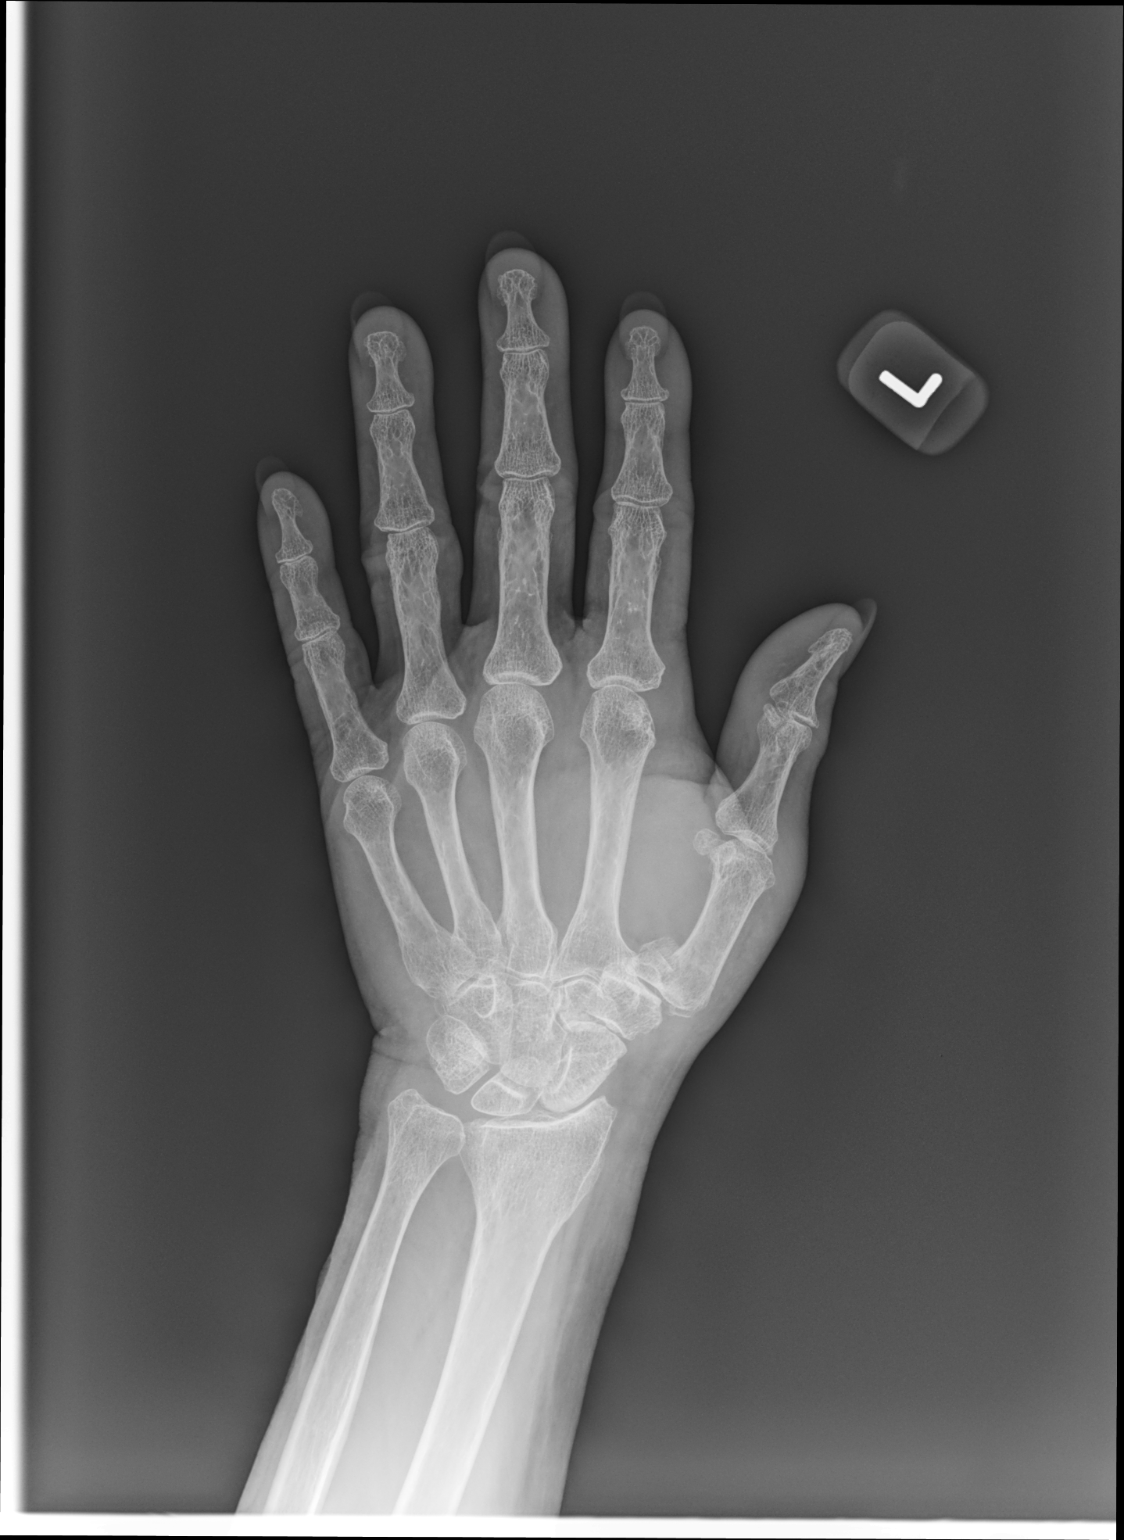

[hand mlo]
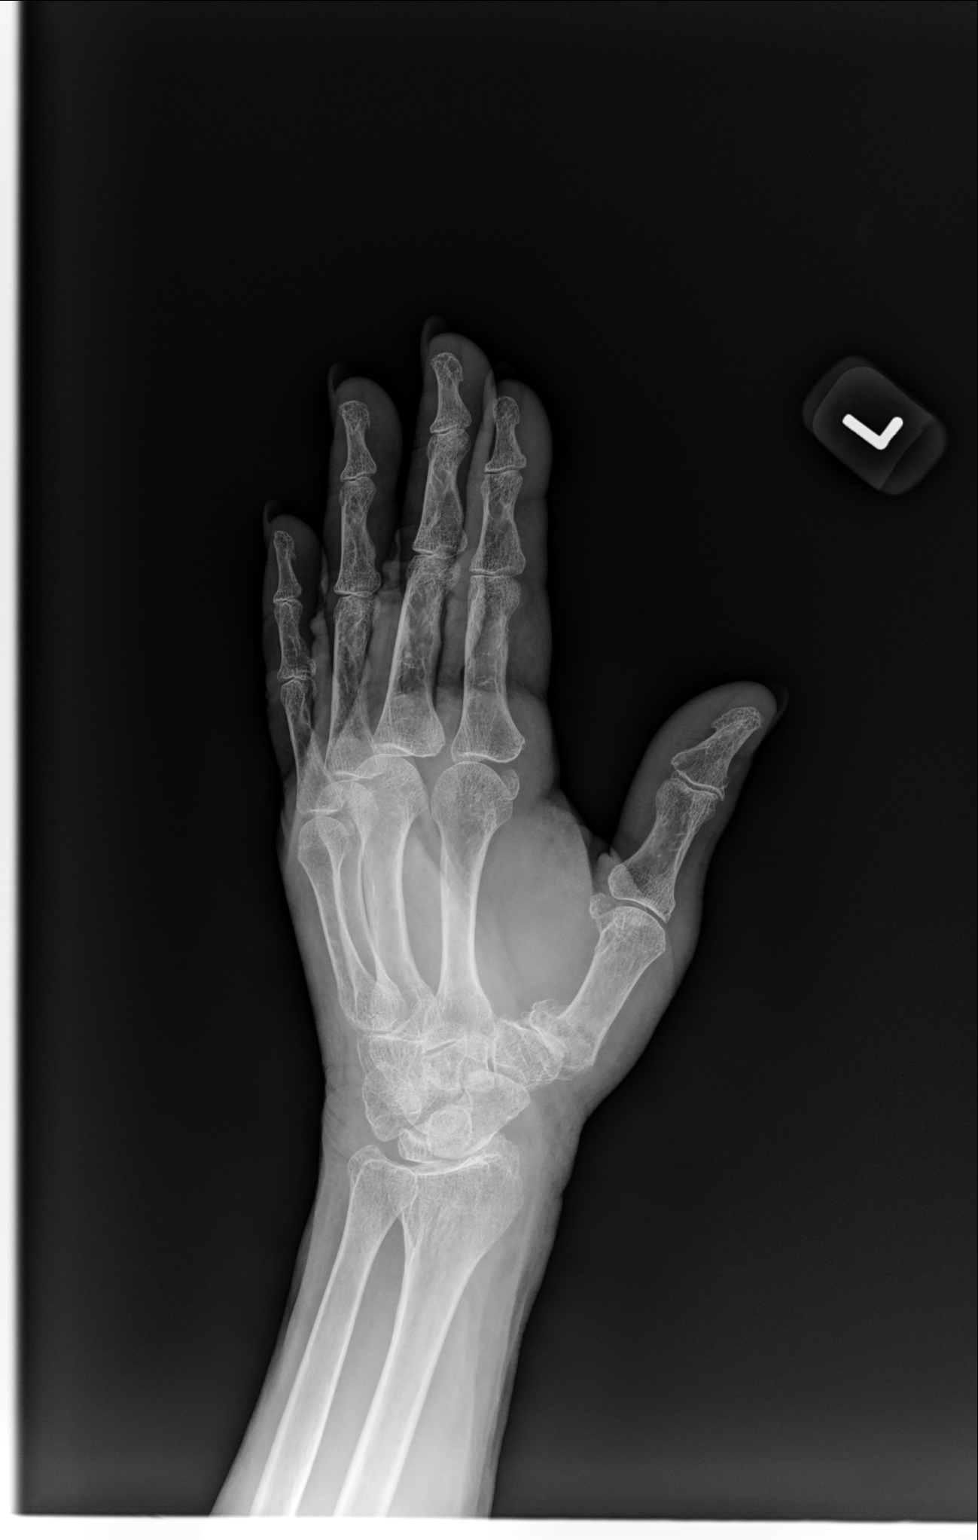

[hand lat]
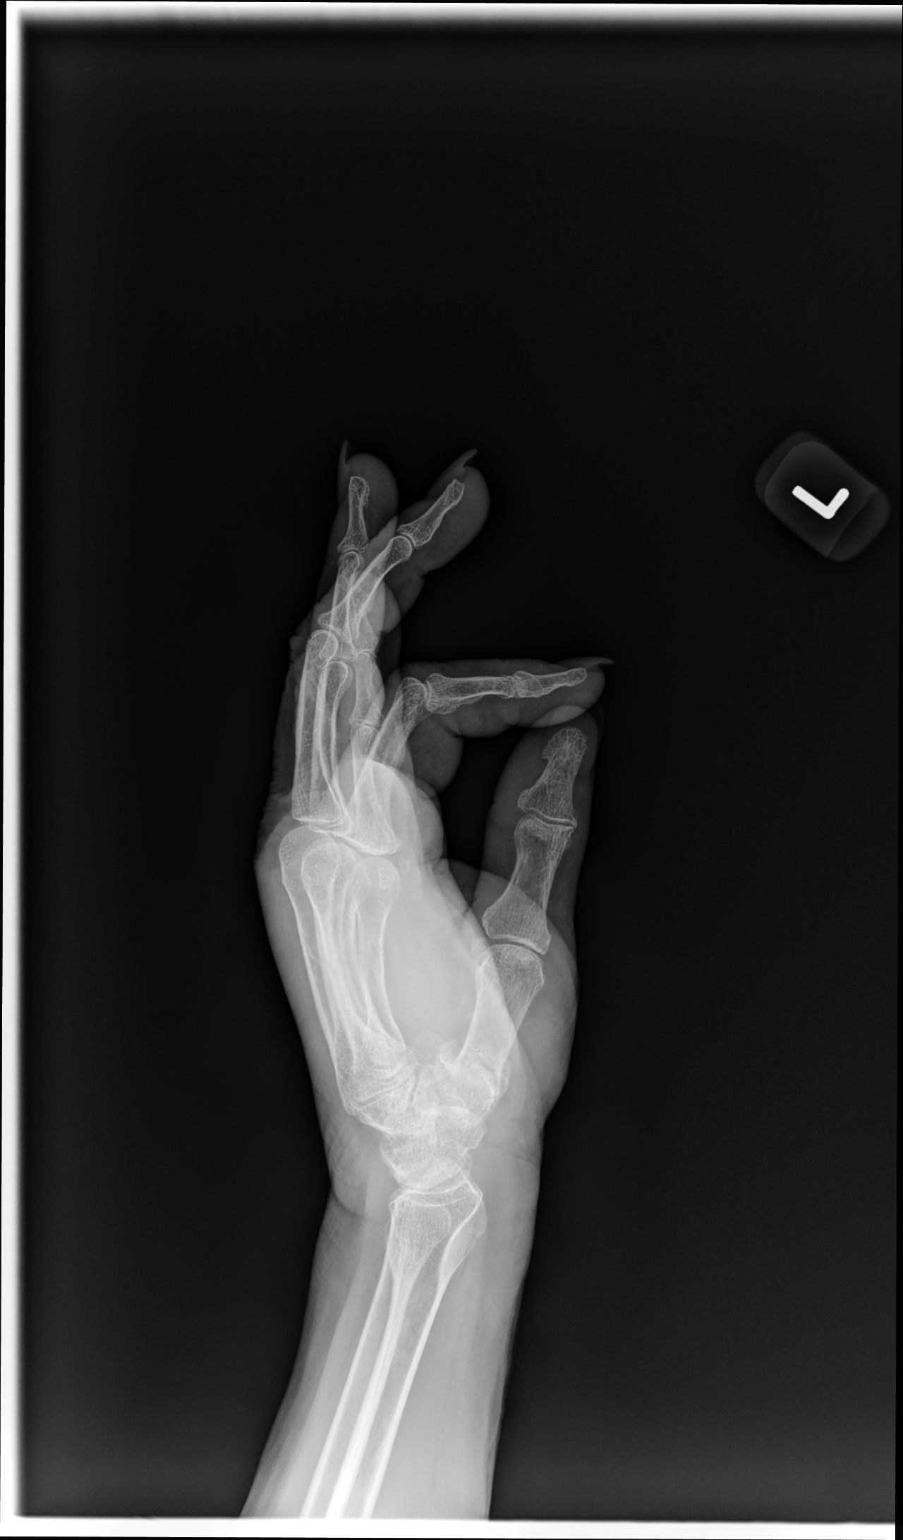

[3 of 3 positions shown; findings below may reference images not displayed]

FINDINGS: There is a fracture at the base of the first metacarpal with mild
impaction at the fracture site. No other definitive fracture is
seen. Mild soft tissue swelling is noted.
IMPRESSION: Fracture at the base of the first metacarpal with mild impaction. No
other focal abnormality is noted.

## 2021-12-29 ENCOUNTER — Telehealth: Payer: Self-pay

## 2021-12-29 NOTE — Telephone Encounter (Signed)
I returned the pt's call and was advised by her that she was seen by you a couple of months ago and she advised me that she is still constipated and she has been doing the fiber and it seems to make her more constipated. She advised me that she took a laxative Saturday just to have a BM. Her question to you is do you want her to continue this treatment until she comes back in July or do you have further recommendations for her to try. Please advise

## 2021-12-29 NOTE — Telephone Encounter (Signed)
Phoned and LMOVM for the pt to return call 

## 2021-12-29 NOTE — Telephone Encounter (Signed)
Returned the pt's call from vm. No ans/vm.

## 2021-12-29 NOTE — Telephone Encounter (Signed)
Back the fiber down to just once per day as she has done in the past. Has she tried the stool softener daily to twice a day? (Docusate sodium 100 mg). Miralax has cramped her in the past, but that's definitely another option to take 1 capful of this daily as needed. Linzess caused diarrhea in the past, and Amitiza was not covered well with insurance.

## 2021-12-30 NOTE — Telephone Encounter (Signed)
Pt was made aware and verbalized understanding. Pt states that she is using the stool softener daily to twice a day and will add the miralax to see if that helps. Pt will call us with an update.

## 2021-12-31 DIAGNOSIS — F419 Anxiety disorder, unspecified: Secondary | ICD-10-CM | POA: Diagnosis not present

## 2021-12-31 DIAGNOSIS — F331 Major depressive disorder, recurrent, moderate: Secondary | ICD-10-CM | POA: Diagnosis not present

## 2022-01-05 NOTE — Telephone Encounter (Signed)
Pt called back regarding the instructions to her Miralax. Pt wanted to make sure that if she needed to take it more than once a day she could. I advised the pt that right now if the once a day is working that fine, but if needed she can go to 2 glasses a day or more if needed. Pt reassured me that the once a day is working fine.

## 2022-01-29 DIAGNOSIS — F419 Anxiety disorder, unspecified: Secondary | ICD-10-CM | POA: Diagnosis not present

## 2022-01-29 DIAGNOSIS — F331 Major depressive disorder, recurrent, moderate: Secondary | ICD-10-CM | POA: Diagnosis not present

## 2022-02-03 ENCOUNTER — Ambulatory Visit (INDEPENDENT_AMBULATORY_CARE_PROVIDER_SITE_OTHER): Payer: Medicare HMO | Admitting: Gastroenterology

## 2022-02-03 ENCOUNTER — Encounter: Payer: Self-pay | Admitting: Gastroenterology

## 2022-02-03 VITALS — BP 119/74 | HR 95 | Temp 98.3°F | Ht <= 58 in | Wt 167.0 lb

## 2022-02-03 DIAGNOSIS — K5900 Constipation, unspecified: Secondary | ICD-10-CM

## 2022-02-03 NOTE — Patient Instructions (Signed)
Continue the Benefiber as you are doing!  Continue Miralax as needed.  I am so glad you are doing better!  Enjoy your trip to the mountains!  We will see you in 1 year!  I enjoyed seeing you again today! As you know, I value our relationship and want to provide genuine, compassionate, and quality care. I welcome your feedback. If you receive a survey regarding your visit,  I greatly appreciate you taking time to fill this out. See you next time!  Annitta Needs, PhD, ANP-BC [facility]

## 2022-02-03 NOTE — Progress Notes (Signed)
Gastroenterology Office Note     Primary Care Physician:  Selinda Flavin, MD  Primary Gastroenterologist: Dr. Jena Gauss   Chief Complaint   Chief Complaint  Patient presents with   Follow-up     History of Present Illness   Rachael Morgan is an 81 y.o. female presenting today in follow-up with a history of constipation and GERD. Colonoscopy in 2021 due to positive Cologuard with one 2 mm polyp, one 7 mm polyp, diverticulosis. Tubular adenomas.    Constipation: historically, Linzess caused diarrhea. Amitiza not covered well under insurance. Miralax has been used along with fiber in the past.   Taking Benefiber and Miralax. She notes excellent results with this. Much improved from last visit. No abdominal pain. No GERD.    Past Medical History:  Diagnosis Date   Anxiety and depression    Hypercholesterolemia     Past Surgical History:  Procedure Laterality Date   CHOLECYSTECTOMY     COLONOSCOPY  2010   [place]: normal   COLONOSCOPY WITH PROPOFOL N/A 01/15/2020   due to positive Cologuard with one 2 mm polyp, one 7 mm polyp, diverticulosis. Tubular adenomas.   left breast lumpectomy     benign   POLYPECTOMY  01/15/2020   Procedure: POLYPECTOMY;  Surgeon: Corbin Ade, MD;  [facility];  Service: Endoscopy;;    Current Outpatient Medications  Medication Sig Dispense Refill   ALPRAZolam (XANAX) 0.25 MG tablet Take 0.25 mg by mouth 3 (three) times daily as needed.     atorvastatin (LIPITOR) 20 MG tablet Take 20 mg by mouth daily.     docusate sodium (COLACE) 100 MG capsule Take 100 mg by mouth daily as needed for mild constipation.     DULoxetine (CYMBALTA) 30 MG capsule Take 30 mg by mouth daily.     fluticasone (FLONASE) 50 MCG/ACT nasal spray Place 1 spray into both nostrils daily.     montelukast (SINGULAIR) 10 MG tablet Take 10 mg by mouth daily.     propranolol (INDERAL) 10 MG tablet Take 10 mg by mouth daily.     Wheat Dextrin  (BENEFIBER DRINK MIX PO) Take 4 g by mouth daily.     fexofenadine (ALLEGRA) 180 MG tablet Take 180 mg by mouth daily as needed for allergies or rhinitis. (Patient not taking: Reported on 02/03/2022)     Multiple Minerals (CALCIUM-MAGNESIUM-ZINC) TABS Take by mouth. (Patient not taking: Reported on 02/03/2022)     PARoxetine (PAXIL) 30 MG tablet Take 30 mg by mouth daily. (Patient not taking: Reported on 02/03/2022)     No current facility-administered medications for this visit.    Allergies as of 02/03/2022 - Review Complete 02/03/2022  Allergen Reaction Noted   Sulfa antibiotics Nausea Only 09/24/2017    Family History  Problem Relation Age of Onset   Colon cancer Maternal Grandmother    Colon polyps Neg Hx     Social History   Socioeconomic History   Marital status: Widowed    Spouse name: Not on file   Number of children: Not on file   Years of education: Not on file   Highest education level: Not on file  Occupational History   Not on file  Tobacco Use   Smoking status: Never   Smokeless tobacco: Never  Substance and Sexual Activity   Alcohol use: No   Drug use: No   Sexual activity: Not Currently  Other Topics Concern   Not on file  Social  History Narrative   Not on file   Social Determinants of Health   Financial Resource Strain: Not on file  Food Insecurity: Not on file  Transportation Needs: Not on file  Physical Activity: Not on file  Stress: Not on file  Social Connections: Not on file  Intimate Partner Violence: Not on file     Review of Systems   Gen: Denies any fever, chills, fatigue, weight loss, lack of appetite.  CV: Denies chest pain, heart palpitations, peripheral edema, syncope.  Resp: Denies shortness of breath at rest or with exertion. Denies wheezing or cough.  GI: see HPI GU : Denies urinary burning, urinary frequency, urinary hesitancy MS: Denies joint pain, muscle weakness, cramps, or limitation of movement.  Derm: Denies rash,  itching, dry skin Psych: Denies depression, anxiety, memory loss, and confusion Heme: Denies bruising, bleeding, and enlarged lymph nodes.   Physical Exam   BP 119/74 (BP Location: Right Arm, Patient Position: Sitting, Cuff Size: Normal)   Pulse 95   Temp 98.3 F (36.8 C) (Temporal)   Ht 4\' 8"  (1.422 m)   Wt 167 lb (75.8 kg)   BMI 37.44 kg/m  General:   Alert and oriented. Pleasant and cooperative. Well-nourished and well-developed.  Head:  Normocephalic and atraumatic. Eyes:  Without icterus Abdomen:  +BS, soft, non-tender and non-distended. No HSM noted. No guarding or rebound. No masses appreciated.  Rectal:  Deferred  Msk:  Symmetrical without gross deformities. Normal posture. Extremities:  Without edema. Neurologic:  Alert and  oriented x4;  grossly normal neurologically. Skin:  Intact without significant lesions or rashes. Psych:  Alert and cooperative. Normal mood and affect.   Assessment   Rachael Morgan is a 81 y.o. female presenting today in follow-up with a history of constipation and GERD.   Constipation has responded well to Benefiber and Miralax. No GERD symptoms currently.    As she is doing well, will see her back in 1 year.     PLAN    Continue Benefiber daily Miralax prn Return in 1 year   Gelene Mink, PhD, [facility]

## 2022-02-11 DIAGNOSIS — E782 Mixed hyperlipidemia: Secondary | ICD-10-CM | POA: Diagnosis not present

## 2022-02-11 DIAGNOSIS — E7849 Other hyperlipidemia: Secondary | ICD-10-CM | POA: Diagnosis not present

## 2022-02-11 DIAGNOSIS — E7801 Familial hypercholesterolemia: Secondary | ICD-10-CM | POA: Diagnosis not present

## 2022-02-11 DIAGNOSIS — Z1329 Encounter for screening for other suspected endocrine disorder: Secondary | ICD-10-CM | POA: Diagnosis not present

## 2022-02-11 DIAGNOSIS — E7800 Pure hypercholesterolemia, unspecified: Secondary | ICD-10-CM | POA: Diagnosis not present

## 2022-02-11 DIAGNOSIS — I10 Essential (primary) hypertension: Secondary | ICD-10-CM | POA: Diagnosis not present

## 2022-02-17 DIAGNOSIS — N1831 Chronic kidney disease, stage 3a: Secondary | ICD-10-CM | POA: Diagnosis not present

## 2022-02-17 DIAGNOSIS — K760 Fatty (change of) liver, not elsewhere classified: Secondary | ICD-10-CM | POA: Diagnosis not present

## 2022-02-17 DIAGNOSIS — E7849 Other hyperlipidemia: Secondary | ICD-10-CM | POA: Diagnosis not present

## 2022-02-17 DIAGNOSIS — R4582 Worries: Secondary | ICD-10-CM | POA: Diagnosis not present

## 2022-02-17 DIAGNOSIS — Z6835 Body mass index (BMI) 35.0-35.9, adult: Secondary | ICD-10-CM | POA: Diagnosis not present

## 2022-02-17 DIAGNOSIS — J301 Allergic rhinitis due to pollen: Secondary | ICD-10-CM | POA: Diagnosis not present

## 2022-02-17 DIAGNOSIS — F331 Major depressive disorder, recurrent, moderate: Secondary | ICD-10-CM | POA: Diagnosis not present

## 2022-02-17 DIAGNOSIS — Z0001 Encounter for general adult medical examination with abnormal findings: Secondary | ICD-10-CM | POA: Diagnosis not present

## 2022-02-17 DIAGNOSIS — Z23 Encounter for immunization: Secondary | ICD-10-CM | POA: Diagnosis not present

## 2022-02-23 DIAGNOSIS — R7989 Other specified abnormal findings of blood chemistry: Secondary | ICD-10-CM | POA: Diagnosis not present

## 2022-02-23 DIAGNOSIS — K760 Fatty (change of) liver, not elsewhere classified: Secondary | ICD-10-CM | POA: Diagnosis not present

## 2022-03-04 DIAGNOSIS — F419 Anxiety disorder, unspecified: Secondary | ICD-10-CM | POA: Diagnosis not present

## 2022-03-04 DIAGNOSIS — F331 Major depressive disorder, recurrent, moderate: Secondary | ICD-10-CM | POA: Diagnosis not present

## 2022-04-30 DIAGNOSIS — H353131 Nonexudative age-related macular degeneration, bilateral, early dry stage: Secondary | ICD-10-CM | POA: Diagnosis not present

## 2022-04-30 DIAGNOSIS — Z961 Presence of intraocular lens: Secondary | ICD-10-CM | POA: Diagnosis not present

## 2022-04-30 DIAGNOSIS — H52222 Regular astigmatism, left eye: Secondary | ICD-10-CM | POA: Diagnosis not present

## 2022-04-30 DIAGNOSIS — H5203 Hypermetropia, bilateral: Secondary | ICD-10-CM | POA: Diagnosis not present

## 2022-05-19 DIAGNOSIS — Z23 Encounter for immunization: Secondary | ICD-10-CM | POA: Diagnosis not present

## 2022-05-19 DIAGNOSIS — S60222A Contusion of left hand, initial encounter: Secondary | ICD-10-CM | POA: Diagnosis not present

## 2022-05-19 DIAGNOSIS — W1839XA Other fall on same level, initial encounter: Secondary | ICD-10-CM | POA: Diagnosis not present

## 2022-05-19 DIAGNOSIS — F331 Major depressive disorder, recurrent, moderate: Secondary | ICD-10-CM | POA: Diagnosis not present

## 2022-05-19 DIAGNOSIS — S61412A Laceration without foreign body of left hand, initial encounter: Secondary | ICD-10-CM | POA: Diagnosis not present

## 2022-05-19 DIAGNOSIS — F419 Anxiety disorder, unspecified: Secondary | ICD-10-CM | POA: Diagnosis not present

## 2022-06-01 DIAGNOSIS — Z4802 Encounter for removal of sutures: Secondary | ICD-10-CM | POA: Diagnosis not present

## 2022-06-01 DIAGNOSIS — W268XXA Contact with other sharp object(s), not elsewhere classified, initial encounter: Secondary | ICD-10-CM | POA: Diagnosis not present

## 2022-06-01 DIAGNOSIS — S61412D Laceration without foreign body of left hand, subsequent encounter: Secondary | ICD-10-CM | POA: Diagnosis not present

## 2022-06-03 DIAGNOSIS — F331 Major depressive disorder, recurrent, moderate: Secondary | ICD-10-CM | POA: Diagnosis not present

## 2022-06-03 DIAGNOSIS — Z23 Encounter for immunization: Secondary | ICD-10-CM | POA: Diagnosis not present

## 2022-06-03 DIAGNOSIS — F419 Anxiety disorder, unspecified: Secondary | ICD-10-CM | POA: Diagnosis not present

## 2022-07-30 DIAGNOSIS — R109 Unspecified abdominal pain: Secondary | ICD-10-CM | POA: Diagnosis not present

## 2022-07-30 DIAGNOSIS — R5383 Other fatigue: Secondary | ICD-10-CM | POA: Diagnosis not present

## 2022-07-30 DIAGNOSIS — E7849 Other hyperlipidemia: Secondary | ICD-10-CM | POA: Diagnosis not present

## 2022-08-05 DIAGNOSIS — N1831 Chronic kidney disease, stage 3a: Secondary | ICD-10-CM | POA: Diagnosis not present

## 2022-08-05 DIAGNOSIS — H6122 Impacted cerumen, left ear: Secondary | ICD-10-CM | POA: Diagnosis not present

## 2022-08-05 DIAGNOSIS — Z23 Encounter for immunization: Secondary | ICD-10-CM | POA: Diagnosis not present

## 2022-08-05 DIAGNOSIS — Z0001 Encounter for general adult medical examination with abnormal findings: Secondary | ICD-10-CM | POA: Diagnosis not present

## 2022-08-05 DIAGNOSIS — K760 Fatty (change of) liver, not elsewhere classified: Secondary | ICD-10-CM | POA: Diagnosis not present

## 2022-08-05 DIAGNOSIS — J301 Allergic rhinitis due to pollen: Secondary | ICD-10-CM | POA: Diagnosis not present

## 2022-08-05 DIAGNOSIS — F331 Major depressive disorder, recurrent, moderate: Secondary | ICD-10-CM | POA: Diagnosis not present

## 2022-08-05 DIAGNOSIS — E7849 Other hyperlipidemia: Secondary | ICD-10-CM | POA: Diagnosis not present

## 2022-08-12 DIAGNOSIS — Z23 Encounter for immunization: Secondary | ICD-10-CM | POA: Diagnosis not present

## 2022-08-26 DIAGNOSIS — I10 Essential (primary) hypertension: Secondary | ICD-10-CM | POA: Diagnosis not present

## 2022-08-26 DIAGNOSIS — F331 Major depressive disorder, recurrent, moderate: Secondary | ICD-10-CM | POA: Diagnosis not present

## 2022-08-26 DIAGNOSIS — E782 Mixed hyperlipidemia: Secondary | ICD-10-CM | POA: Diagnosis not present

## 2022-09-02 DIAGNOSIS — Z1231 Encounter for screening mammogram for malignant neoplasm of breast: Secondary | ICD-10-CM | POA: Diagnosis not present

## 2022-09-03 DIAGNOSIS — M810 Age-related osteoporosis without current pathological fracture: Secondary | ICD-10-CM | POA: Diagnosis not present

## 2022-09-03 DIAGNOSIS — Z13820 Encounter for screening for osteoporosis: Secondary | ICD-10-CM | POA: Diagnosis not present

## 2022-11-02 ENCOUNTER — Ambulatory Visit: Payer: Medicare HMO | Admitting: Gastroenterology

## 2022-11-03 ENCOUNTER — Telehealth: Payer: Self-pay

## 2022-11-03 NOTE — Telephone Encounter (Signed)
Pt LMOVM needing an appt to be seen. Please call. She had appt yesterday but didn't follow through

## 2022-11-06 NOTE — Telephone Encounter (Signed)
noted 

## 2022-11-19 ENCOUNTER — Ambulatory Visit: Payer: Medicare HMO | Admitting: Gastroenterology

## 2022-11-19 ENCOUNTER — Encounter: Payer: Self-pay | Admitting: Gastroenterology

## 2022-11-19 VITALS — BP 123/75 | HR 85 | Temp 97.9°F | Ht <= 58 in | Wt 159.6 lb

## 2022-11-19 DIAGNOSIS — K5900 Constipation, unspecified: Secondary | ICD-10-CM

## 2022-11-19 DIAGNOSIS — K648 Other hemorrhoids: Secondary | ICD-10-CM | POA: Diagnosis not present

## 2022-11-19 NOTE — Progress Notes (Signed)
Gastroenterology Office Note     Primary Care Physician:  Selinda Flavin, MD  Primary Gastroenterologist:  Dr. Jena Gauss    Chief Complaint   Chief Complaint  Patient presents with   Follow-up    Pt states she has several things she needs to discuss. She only advised of her stools     History of Present Illness   Rachael Morgan is a delightful 82 y.o. female presenting today in follow-up with a history of constipation and GERD. Colonoscopy in 2021 due to positive Cologuard with one 2 mm polyp, one 7 mm polyp, diverticulosis. Tubular adenomas.   Stool caliber changed, some bristol 1, some mushier. No rectal bleeding. Fleeting abdominal discomfort with BM occasionally. Benefiber daily. Purposeful weight loss with changing diet. She has cut out chocolate, specifically.   She has been dealing with some anxiety since the loss of her husband in 2021. She is working on [employer] some flowers by the house. This keeps her active.      Past Medical History:  Diagnosis Date   Anxiety and depression    Hypercholesterolemia     Past Surgical History:  Procedure Laterality Date   CHOLECYSTECTOMY     COLONOSCOPY  2010   [place]: normal   COLONOSCOPY WITH PROPOFOL N/A 01/15/2020   due to positive Cologuard with one 2 mm polyp, one 7 mm polyp, diverticulosis. Tubular adenomas.   left breast lumpectomy     benign   POLYPECTOMY  01/15/2020   Procedure: POLYPECTOMY;  Surgeon: Corbin Ade, MD;  [facility];  Service: Endoscopy;;    Current Outpatient Medications  Medication Sig Dispense Refill   ALPRAZolam (XANAX) 0.25 MG tablet Take 0.25 mg by mouth 3 (three) times daily as needed.     atorvastatin (LIPITOR) 20 MG tablet Take 20 mg by mouth daily.     docusate sodium (COLACE) 100 MG capsule Take 100 mg by mouth daily as needed for mild constipation.     DULoxetine (CYMBALTA) 30 MG capsule Take 30 mg by mouth daily.     fluticasone (FLONASE) 50 MCG/ACT nasal  spray Place 1 spray into both nostrils daily.     montelukast (SINGULAIR) 10 MG tablet Take 10 mg by mouth daily.     propranolol (INDERAL) 10 MG tablet Take 10 mg by mouth daily.     Wheat Dextrin (BENEFIBER DRINK MIX PO) Take 4 g by mouth daily.     No current facility-administered medications for this visit.    Allergies as of 11/19/2022 - Review Complete 11/19/2022  Allergen Reaction Noted   Sulfa antibiotics Nausea Only 09/24/2017    Family History  Problem Relation Age of Onset   Colon cancer Maternal Grandmother    Colon polyps Neg Hx     Social History   Socioeconomic History   Marital status: Widowed    Spouse name: Not on file   Number of children: Not on file   Years of education: Not on file   Highest education level: Not on file  Occupational History   Not on file  Tobacco Use   Smoking status: Never   Smokeless tobacco: Never  Substance and Sexual Activity   Alcohol use: No   Drug use: No   Sexual activity: Not Currently  Other Topics Concern   Not on file  Social History Narrative   Not on file   Social Determinants of Health   Financial Resource Strain: Not on file  Food Insecurity: Not  on file  Transportation Needs: Not on file  Physical Activity: Not on file  Stress: Not on file  Social Connections: Not on file  Intimate Partner Violence: Not on file     Review of Systems   Gen: Denies any fever, chills, fatigue, weight loss, lack of appetite.  CV: Denies chest pain, heart palpitations, peripheral edema, syncope.  Resp: Denies shortness of breath at rest or with exertion. Denies wheezing or cough.  GI: Denies dysphagia or odynophagia. Denies jaundice, hematemesis, fecal incontinence. GU : Denies urinary burning, urinary frequency, urinary hesitancy MS: Denies joint pain, muscle weakness, cramps, or limitation of movement.  Derm: Denies rash, itching, dry skin Psych: Denies depression, anxiety, memory loss, and confusion Heme: Denies  bruising, bleeding, and enlarged lymph nodes.   Physical Exam   BP 123/75   Pulse 85   Temp 97.9 F (36.6 C)   Ht  (1.422 m)   Wt 159 lb 9.6 oz (72.4 kg)   BMI 35.78 kg/m  General:   Alert and oriented. Pleasant and cooperative. Well-nourished and well-developed.  Head:  Normocephalic and atraumatic. Eyes:  Without icterus Abdomen:  +BS, soft, non-tender and non-distended. No HSM noted. No guarding or rebound. No masses appreciated.  Rectal:  small prolapsing Grade 3 right anterior hemorrhoid. DRE without mass or discomfort. Soft stool in rectal vault.  Msk:  Symmetrical without gross deformities. Normal posture. Extremities:  Without edema. Neurologic:  Alert and  oriented x4;  grossly normal neurologically. Skin:  Intact without significant lesions or rashes. Psych:  Alert and cooperative. Normal mood and affect.   Assessment   Rachael Morgan is a delightful 82 y.o. female presenting today with concerns regarding bowel habit changes.  From description, she is having unproductive stools at times, with a baseline of constipation. No alarm signs/symptoms. DRE without mass but did have soft stool in rectal vault. I feel she would benefit from increasing Benefiber to 2-3 times per day instead of once; she has noted improvement with Benefiber historically.  We discussed briefly the option of hemorrhoid banding, if the prolapsing tissue becomes bothersome.  Colonoscopy on file from 2021.   PLAN    Increase Benefiber Call if desires banding 3 month close follow-up   Gelene Mink, PhD, ANP-BC [facility]

## 2022-11-19 NOTE — Patient Instructions (Signed)
Let's increase the fiber to 2-3 times per day!   Avoid straining, limit time on the toilet to 2-3 minutes at the most.  Let me know if you would like to do a hemorrhoid banding in the office!  I will see you in 3 months!  I enjoyed seeing you again today! At our first visit, I mentioned how I value our relationship and want to provide genuine, compassionate, and quality care. You may receive a survey regarding your visit with me, and I welcome your feedback! Thanks so much for taking the time to complete this. I look forward to seeing you again.   Gelene Mink, PhD, ANP-BC [facility]

## 2022-11-25 DIAGNOSIS — I10 Essential (primary) hypertension: Secondary | ICD-10-CM | POA: Diagnosis not present

## 2022-11-25 DIAGNOSIS — Z1329 Encounter for screening for other suspected endocrine disorder: Secondary | ICD-10-CM | POA: Diagnosis not present

## 2022-11-25 DIAGNOSIS — E7849 Other hyperlipidemia: Secondary | ICD-10-CM | POA: Diagnosis not present

## 2022-11-25 DIAGNOSIS — E782 Mixed hyperlipidemia: Secondary | ICD-10-CM | POA: Diagnosis not present

## 2022-11-25 DIAGNOSIS — N1831 Chronic kidney disease, stage 3a: Secondary | ICD-10-CM | POA: Diagnosis not present

## 2022-11-25 DIAGNOSIS — K760 Fatty (change of) liver, not elsewhere classified: Secondary | ICD-10-CM | POA: Diagnosis not present

## 2022-12-01 DIAGNOSIS — K760 Fatty (change of) liver, not elsewhere classified: Secondary | ICD-10-CM | POA: Diagnosis not present

## 2022-12-01 DIAGNOSIS — R030 Elevated blood-pressure reading, without diagnosis of hypertension: Secondary | ICD-10-CM | POA: Diagnosis not present

## 2022-12-01 DIAGNOSIS — Z6833 Body mass index (BMI) 33.0-33.9, adult: Secondary | ICD-10-CM | POA: Diagnosis not present

## 2022-12-01 DIAGNOSIS — E7849 Other hyperlipidemia: Secondary | ICD-10-CM | POA: Diagnosis not present

## 2022-12-01 DIAGNOSIS — J301 Allergic rhinitis due to pollen: Secondary | ICD-10-CM | POA: Diagnosis not present

## 2022-12-01 DIAGNOSIS — F331 Major depressive disorder, recurrent, moderate: Secondary | ICD-10-CM | POA: Diagnosis not present

## 2022-12-01 DIAGNOSIS — R4582 Worries: Secondary | ICD-10-CM | POA: Diagnosis not present

## 2022-12-01 DIAGNOSIS — N1831 Chronic kidney disease, stage 3a: Secondary | ICD-10-CM | POA: Diagnosis not present

## 2022-12-29 DIAGNOSIS — M25561 Pain in right knee: Secondary | ICD-10-CM | POA: Diagnosis not present

## 2022-12-29 DIAGNOSIS — Z20822 Contact with and (suspected) exposure to covid-19: Secondary | ICD-10-CM | POA: Diagnosis not present

## 2022-12-29 DIAGNOSIS — M25562 Pain in left knee: Secondary | ICD-10-CM | POA: Diagnosis not present

## 2022-12-29 DIAGNOSIS — R0981 Nasal congestion: Secondary | ICD-10-CM | POA: Diagnosis not present

## 2022-12-29 DIAGNOSIS — J018 Other acute sinusitis: Secondary | ICD-10-CM | POA: Diagnosis not present

## 2022-12-29 DIAGNOSIS — B9689 Other specified bacterial agents as the cause of diseases classified elsewhere: Secondary | ICD-10-CM | POA: Diagnosis not present

## 2022-12-29 DIAGNOSIS — J019 Acute sinusitis, unspecified: Secondary | ICD-10-CM | POA: Diagnosis not present

## 2022-12-29 DIAGNOSIS — Z1152 Encounter for screening for COVID-19: Secondary | ICD-10-CM | POA: Diagnosis not present

## 2023-01-06 DIAGNOSIS — R030 Elevated blood-pressure reading, without diagnosis of hypertension: Secondary | ICD-10-CM | POA: Diagnosis not present

## 2023-01-06 DIAGNOSIS — J309 Allergic rhinitis, unspecified: Secondary | ICD-10-CM | POA: Diagnosis not present

## 2023-01-06 DIAGNOSIS — J329 Chronic sinusitis, unspecified: Secondary | ICD-10-CM | POA: Diagnosis not present

## 2023-01-06 DIAGNOSIS — Z6833 Body mass index (BMI) 33.0-33.9, adult: Secondary | ICD-10-CM | POA: Diagnosis not present

## 2023-01-24 DIAGNOSIS — F3281 Premenstrual dysphoric disorder: Secondary | ICD-10-CM | POA: Diagnosis not present

## 2023-01-24 DIAGNOSIS — E782 Mixed hyperlipidemia: Secondary | ICD-10-CM | POA: Diagnosis not present

## 2023-01-24 DIAGNOSIS — I10 Essential (primary) hypertension: Secondary | ICD-10-CM | POA: Diagnosis not present

## 2023-01-26 ENCOUNTER — Telehealth: Payer: Self-pay | Admitting: Gastroenterology

## 2023-01-26 NOTE — Telephone Encounter (Signed)
Rachael Morgan approved banding

## 2023-01-26 NOTE — Telephone Encounter (Signed)
Pt spoke to front staff (was on lunch) pt wants a banding done at the time of her appt

## 2023-01-26 NOTE — Telephone Encounter (Signed)
noted 

## 2023-01-26 NOTE — Telephone Encounter (Signed)
Yes - that would be fine

## 2023-01-26 NOTE — Telephone Encounter (Signed)
Patient left a message about the upcoming appt she has.  She wanted to find out if she could have a banding done when she comes in.

## 2023-02-18 ENCOUNTER — Ambulatory Visit: Payer: Medicare HMO | Admitting: Gastroenterology

## 2023-02-18 ENCOUNTER — Encounter: Payer: Self-pay | Admitting: Gastroenterology

## 2023-02-18 VITALS — BP 115/73 | HR 84 | Temp 98.7°F | Ht <= 58 in | Wt 156.6 lb

## 2023-02-18 DIAGNOSIS — K648 Other hemorrhoids: Secondary | ICD-10-CM | POA: Diagnosis not present

## 2023-02-18 NOTE — Progress Notes (Signed)
    [facility] BANDING PROCEDURE NOTE  Rachael Morgan is an 82 y.o. female presenting today for consideration of hemorrhoid banding. Last colonoscopy 2021 due to positive Cologuard with one 2 mm polyp, one 7 mm polyp, diverticulosis. Tubular adenomas.    The patient presents with symptomatic grade 3 hemorrhoids, unresponsive to maximal medical therapy, requesting rubber band ligation of her hemorrhoidal disease. All risks, benefits, and alternative forms of therapy were described and informed consent was obtained.   The decision was made to band the right anterior internal hemorrhoid, and the [facility] was used to perform band ligation without complication. Digital anorectal examination was then performed to assure proper positioning of the band, and to adjust the banded tissue as required. The patient was discharged home without pain or other issues. Dietary and behavioral recommendations were given, along with follow-up instructions. The patient will return in several weeks for followup and possible additional banding as required. Will focus on left lateral column at next visit.   No complications were encountered and the patient tolerated the procedure well.   Gelene Mink, PhD, ANP-BC [facility]

## 2023-02-18 NOTE — Patient Instructions (Signed)
  Please avoid straining.  You should limit your toilet time to 2-3 minutes at the most.   I recommend Benefiber 2 teaspoons each morning in the beverage of your choice! You can increase this as you are doing. I recommend adding a stool softener twice a day every day. Let me know if this works!  Please call me with any concerns or issues!  I will see you in follow-up for additional banding in several weeks.  I enjoyed seeing you again today! I value our relationship and want to provide genuine, compassionate, and quality care. You may receive a survey regarding your visit with me, and I welcome your feedback! Thanks so much for taking the time to complete this. I look forward to seeing you again.      Gelene Mink, PhD, ANP-BC [facility]

## 2023-03-09 DIAGNOSIS — E7801 Familial hypercholesterolemia: Secondary | ICD-10-CM | POA: Diagnosis not present

## 2023-03-09 DIAGNOSIS — Z1329 Encounter for screening for other suspected endocrine disorder: Secondary | ICD-10-CM | POA: Diagnosis not present

## 2023-03-09 DIAGNOSIS — I10 Essential (primary) hypertension: Secondary | ICD-10-CM | POA: Diagnosis not present

## 2023-03-09 DIAGNOSIS — E7800 Pure hypercholesterolemia, unspecified: Secondary | ICD-10-CM | POA: Diagnosis not present

## 2023-03-09 DIAGNOSIS — E7849 Other hyperlipidemia: Secondary | ICD-10-CM | POA: Diagnosis not present

## 2023-03-09 DIAGNOSIS — E782 Mixed hyperlipidemia: Secondary | ICD-10-CM | POA: Diagnosis not present

## 2023-03-09 DIAGNOSIS — N1831 Chronic kidney disease, stage 3a: Secondary | ICD-10-CM | POA: Diagnosis not present

## 2023-03-09 DIAGNOSIS — E559 Vitamin D deficiency, unspecified: Secondary | ICD-10-CM | POA: Diagnosis not present

## 2023-03-11 ENCOUNTER — Telehealth: Payer: Self-pay

## 2023-03-11 NOTE — Telephone Encounter (Signed)
I recommend Miralax daily. Continue the fiber. She still has hemorrhoids, so we have only banded one column. I am seeing her next week.

## 2023-03-11 NOTE — Telephone Encounter (Signed)
Pt called wanting to speak with you regarding her still having to strain to have a BM. I have tried to call her back but it keeps going to vm.

## 2023-03-11 NOTE — Telephone Encounter (Signed)
Pt has called again stating she is having trouble using the bathroom. She is taking the benefiber but seems as though she still has to strain. Her hemorrhoids are swollen again. Please advise

## 2023-03-11 NOTE — Telephone Encounter (Signed)
Phone and advised the pt of your recommendations. Pt expressed understanding

## 2023-03-16 ENCOUNTER — Encounter: Payer: Medicare HMO | Admitting: Gastroenterology

## 2023-03-16 DIAGNOSIS — J301 Allergic rhinitis due to pollen: Secondary | ICD-10-CM | POA: Diagnosis not present

## 2023-03-16 DIAGNOSIS — M1711 Unilateral primary osteoarthritis, right knee: Secondary | ICD-10-CM | POA: Diagnosis not present

## 2023-03-16 DIAGNOSIS — N1831 Chronic kidney disease, stage 3a: Secondary | ICD-10-CM | POA: Diagnosis not present

## 2023-03-16 DIAGNOSIS — F331 Major depressive disorder, recurrent, moderate: Secondary | ICD-10-CM | POA: Diagnosis not present

## 2023-03-16 DIAGNOSIS — Z0001 Encounter for general adult medical examination with abnormal findings: Secondary | ICD-10-CM | POA: Diagnosis not present

## 2023-03-16 DIAGNOSIS — E7849 Other hyperlipidemia: Secondary | ICD-10-CM | POA: Diagnosis not present

## 2023-03-16 DIAGNOSIS — M25561 Pain in right knee: Secondary | ICD-10-CM | POA: Diagnosis not present

## 2023-03-16 DIAGNOSIS — Z23 Encounter for immunization: Secondary | ICD-10-CM | POA: Diagnosis not present

## 2023-03-16 DIAGNOSIS — K760 Fatty (change of) liver, not elsewhere classified: Secondary | ICD-10-CM | POA: Diagnosis not present

## 2023-03-18 ENCOUNTER — Ambulatory Visit: Payer: Medicare HMO | Admitting: Gastroenterology

## 2023-03-18 ENCOUNTER — Encounter: Payer: Self-pay | Admitting: Gastroenterology

## 2023-03-18 VITALS — BP 98/60 | HR 97 | Temp 98.2°F | Ht <= 58 in | Wt 156.6 lb

## 2023-03-18 DIAGNOSIS — K648 Other hemorrhoids: Secondary | ICD-10-CM | POA: Diagnosis not present

## 2023-03-18 NOTE — Progress Notes (Signed)
    [facility] BANDING PROCEDURE NOTE  Rachael Morgan is an 82 y.o. female presenting today for consideration of hemorrhoid banding. Last colonoscopy 2021 due to positive Cologuard with one 2 mm polyp, one 7 mm polyp, diverticulosis. Tubular adenomas. She has had right anterior banding thus far.   The patient presents with symptomatic grade 3 hemorrhoids, unresponsive to maximal medical therapy, requesting rubber band ligation of her hemorrhoidal disease. All risks, benefits, and alternative forms of therapy were described and informed consent was obtained.    The decision was made to band the left lateral internal hemorrhoid, and the [facility] O'Regan System was used to perform band ligation without complication. Digital anorectal examination was then performed to assure proper positioning of the band, and to adjust the banded tissue as required. The patient was discharged home without pain or other issues. Dietary and behavioral recommendations were given, along with follow-up instructions. The patient will return in several weeks for followup and possible additional banding as required.  No complications were encountered and the patient tolerated the procedure well.   Gelene Mink, PhD, ANP-BC [facility]

## 2023-03-18 NOTE — Patient Instructions (Signed)
  Please avoid straining.  You should limit your toilet time to 2-3 minutes at the most.   I recommend Benefiber 2 teaspoons each morning in the beverage of your choice if this helps you! You can continue the Miralax twice a day if needed.   Please call me with any concerns or issues!  I will see you in follow-up for additional banding in several weeks.  I enjoyed seeing you again today! I value our relationship and want to provide genuine, compassionate, and quality care. You may receive a survey regarding your visit with me, and I welcome your feedback! Thanks so much for taking the time to complete this. I look forward to seeing you again.      Gelene Mink, PhD, ANP-BC [facility]

## 2023-04-05 DIAGNOSIS — F419 Anxiety disorder, unspecified: Secondary | ICD-10-CM | POA: Diagnosis not present

## 2023-04-05 DIAGNOSIS — F32A Depression, unspecified: Secondary | ICD-10-CM | POA: Diagnosis not present

## 2023-04-05 DIAGNOSIS — F4321 Adjustment disorder with depressed mood: Secondary | ICD-10-CM | POA: Diagnosis not present

## 2023-04-13 ENCOUNTER — Encounter: Payer: Self-pay | Admitting: Gastroenterology

## 2023-04-13 ENCOUNTER — Ambulatory Visit: Payer: Medicare HMO | Admitting: Gastroenterology

## 2023-04-13 VITALS — BP 143/84 | HR 86 | Temp 98.3°F | Ht <= 58 in | Wt 156.4 lb

## 2023-04-13 DIAGNOSIS — K648 Other hemorrhoids: Secondary | ICD-10-CM | POA: Diagnosis not present

## 2023-04-13 NOTE — Progress Notes (Signed)
       [facility] BANDING PROCEDURE NOTE  Rachael Morgan is a 82 y.o. female presenting today for consideration of hemorrhoid banding. Last colonoscopy 2021 due to positive Cologuard with one 2 mm polyp, one 7 mm polyp, diverticulosis. Tubular adenomas. She has had right anterior and left lateral banding.     The patient presents with symptomatic grade 2 hemorrhoids, unresponsive to maximal medical therapy, requesting rubber band ligation of his/her hemorrhoidal disease. All risks, benefits, and alternative forms of therapy were described and informed consent was obtained.   The decision was made to band the right posterior internal hemorrhoid, and the [facility] O'Regan System was used to perform band ligation without complication. Digital anorectal examination was then performed to assure proper positioning of the band, and to adjust the banded tissue as required. The patient was discharged home without pain or other issues. Dietary and behavioral recommendations were given, along with follow-up instructions. The patient will return in several weeks for followup and possible additional banding as required.  No complications were encountered and the patient tolerated the procedure well.   Gelene Mink, PhD, ANP-BC [facility]

## 2023-04-13 NOTE — Patient Instructions (Signed)
I will see you back in a few weeks!  Continue the Miralax daily as needed!  Happy Birthday!   I enjoyed seeing you again today! I value our relationship and want to provide genuine, compassionate, and quality care. You may receive a survey regarding your visit with me, and I welcome your feedback! Thanks so much for taking the time to complete this. I look forward to seeing you again.      Gelene Mink, PhD, ANP-BC [facility]

## 2023-05-04 ENCOUNTER — Encounter: Payer: Self-pay | Admitting: Gastroenterology

## 2023-05-04 ENCOUNTER — Ambulatory Visit: Payer: Medicare HMO | Admitting: Gastroenterology

## 2023-05-04 VITALS — BP 129/70 | HR 92 | Temp 97.7°F | Ht <= 58 in | Wt 156.8 lb

## 2023-05-04 DIAGNOSIS — Z09 Encounter for follow-up examination after completed treatment for conditions other than malignant neoplasm: Secondary | ICD-10-CM | POA: Diagnosis not present

## 2023-05-04 DIAGNOSIS — Z8719 Personal history of other diseases of the digestive system: Secondary | ICD-10-CM | POA: Diagnosis not present

## 2023-05-04 DIAGNOSIS — K5900 Constipation, unspecified: Secondary | ICD-10-CM

## 2023-05-04 NOTE — Progress Notes (Signed)
Gastroenterology Office Note     Primary Care Physician:  Selinda Flavin, MD  Primary Gastroenterologist: Dr. Jena Gauss    Chief Complaint   Chief Complaint  Patient presents with   Hemorrhoids    Hemorrhoid banding     History of Present Illness   Rachael Morgan is a delightful 82 y.o. female presenting today with a history of constipation and GERD.  Colonoscopy in 2021 due to positive Cologuard with one 2 mm polyp, one 7 mm polyp, diverticulosis. Tubular adenomas. No repeat colonoscopy due to age. She has had banding of hemorrhoids X all 3 columns,. Here for follow-up after last banding.   She has noticed improvement and now asymptomatic. No overt GI bleeding. Takes Miralax prn for constipation. No abdominal pain. Taking Benefiber. Has no concerns today.     Past Medical History:  Diagnosis Date   Anxiety and depression    Hypercholesterolemia     Past Surgical History:  Procedure Laterality Date   CHOLECYSTECTOMY     COLONOSCOPY  2010   [place]: normal   COLONOSCOPY WITH PROPOFOL N/A 01/15/2020   due to positive Cologuard with one 2 mm polyp, one 7 mm polyp, diverticulosis. Tubular adenomas.   left breast lumpectomy     benign   POLYPECTOMY  01/15/2020   Procedure: POLYPECTOMY;  Surgeon: Corbin Ade, MD;  [facility];  Service: Endoscopy;;    Current Outpatient Medications  Medication Sig Dispense Refill   alendronate (FOSAMAX) 70 MG tablet      ALPRAZolam (XANAX) 0.25 MG tablet Take 0.25 mg by mouth 3 (three) times daily as needed.     atorvastatin (LIPITOR) 20 MG tablet Take 20 mg by mouth daily.     docusate sodium (COLACE) 100 MG capsule Take 100 mg by mouth daily as needed for mild constipation.     DULoxetine (CYMBALTA) 30 MG capsule Take 30 mg by mouth daily.     fluticasone (FLONASE) 50 MCG/ACT nasal spray Place 1 spray into both nostrils daily.     montelukast (SINGULAIR) 10 MG tablet Take 10 mg by mouth daily.      propranolol (INDERAL) 10 MG tablet Take 10 mg by mouth daily.     Vitamin D, Ergocalciferol, (DRISDOL) 1.25 MG (50000 UNIT) CAPS capsule Take 50,000 Units by mouth once a week.     Wheat Dextrin (BENEFIBER DRINK MIX PO) Take 4 g by mouth daily.     No current facility-administered medications for this visit.    Allergies as of 05/04/2023 - Review Complete 05/04/2023  Allergen Reaction Noted   Sulfa antibiotics Nausea Only 09/24/2017    Family History  Problem Relation Age of Onset   Colon cancer Maternal Grandmother    Colon polyps Neg Hx     Social History   Socioeconomic History   Marital status: Widowed    Spouse name: Not on file   Number of children: Not on file   Years of education: Not on file   Highest education level: Not on file  Occupational History   Not on file  Tobacco Use   Smoking status: Never   Smokeless tobacco: Never  Substance and Sexual Activity   Alcohol use: No   Drug use: No   Sexual activity: Not Currently  Other Topics Concern   Not on file  Social History Narrative   Not on file   Social Determinants of Health   Financial Resource Strain: Not on file  Food  Insecurity: Not on file  Transportation Needs: Not on file  Physical Activity: Not on file  Stress: Not on file  Social Connections: Not on file  Intimate Partner Violence: Not on file     Review of Systems   Gen: Denies any fever, chills, fatigue, weight loss, lack of appetite.  CV: Denies chest pain, heart palpitations, peripheral edema, syncope.  Resp: Denies shortness of breath at rest or with exertion. Denies wheezing or cough.  GI: Denies dysphagia or odynophagia. Denies jaundice, hematemesis, fecal incontinence. GU : Denies urinary burning, urinary frequency, urinary hesitancy MS: Denies joint pain, muscle weakness, cramps, or limitation of movement.  Derm: Denies rash, itching, dry skin Psych: Denies depression, anxiety, memory loss, and confusion Heme: Denies  bruising, bleeding, and enlarged lymph nodes.   Physical Exam   BP 129/70 (BP Location: Right Arm, Patient Position: Sitting, Cuff Size: Normal)   Pulse 92   Temp 97.7 F (36.5 C) (Temporal)   Ht 4\' 8"  (1.422 m)   Wt 156 lb 12.8 oz (71.1 kg)   SpO2 94%   BMI 35.15 kg/m  General:   Alert and oriented. Pleasant and cooperative. Well-nourished and well-developed.  Head:  Normocephalic and atraumatic. Eyes:  Without icterus Abdomen:  +BS, soft, non-tender and non-distended. No HSM noted. No guarding or rebound. No masses appreciated.  Rectal:  Deferred  Msk:  Symmetrical without gross deformities. Normal posture. Extremities:  Without edema. Neurologic:  Alert and  oriented x4;  grossly normal neurologically. Skin:  Intact without significant lesions or rashes. Psych:  Alert and cooperative. Normal mood and affect.   Assessment   Rachael Morgan is a delightful 82 y.o. female presenting today with a history of symptomatic hemorrhoids s/p banding of all 3 columns and doing wonderfully.  She will continue Miralax prn, fiber daily, and can return in 1 year. No repeat colonoscopy unless clinical changes.   PLAN    Call if concerns Avoid straining, limit toilet time, continue Miralax and fiber Return in 1 year   Gelene Mink, PhD, [facility]

## 2023-05-04 NOTE — Patient Instructions (Signed)
I am so glad you are doing well!  Continue to avoid straining, limit toilet time to 2-3 minutes. You can take Miralax daily as needed.  I will see you in 1 year or sooner if needed!  No further colonoscopy unless you have concerning symptoms!  I enjoyed seeing you again today! I value our relationship and want to provide genuine, compassionate, and quality care. You may receive a survey regarding your visit with me, and I welcome your feedback! Thanks so much for taking the time to complete this. I look forward to seeing you again.      Gelene Mink, PhD, ANP-BC [facility]

## 2023-05-06 DIAGNOSIS — F4321 Adjustment disorder with depressed mood: Secondary | ICD-10-CM | POA: Diagnosis not present

## 2023-05-06 DIAGNOSIS — F32A Depression, unspecified: Secondary | ICD-10-CM | POA: Diagnosis not present

## 2023-05-06 DIAGNOSIS — Z23 Encounter for immunization: Secondary | ICD-10-CM | POA: Diagnosis not present

## 2023-05-06 DIAGNOSIS — F419 Anxiety disorder, unspecified: Secondary | ICD-10-CM | POA: Diagnosis not present

## 2023-05-12 DIAGNOSIS — M1711 Unilateral primary osteoarthritis, right knee: Secondary | ICD-10-CM | POA: Diagnosis not present

## 2023-05-12 DIAGNOSIS — R030 Elevated blood-pressure reading, without diagnosis of hypertension: Secondary | ICD-10-CM | POA: Diagnosis not present

## 2023-05-12 DIAGNOSIS — N1831 Chronic kidney disease, stage 3a: Secondary | ICD-10-CM | POA: Diagnosis not present

## 2023-06-03 DIAGNOSIS — F419 Anxiety disorder, unspecified: Secondary | ICD-10-CM | POA: Diagnosis not present

## 2023-06-03 DIAGNOSIS — F4321 Adjustment disorder with depressed mood: Secondary | ICD-10-CM | POA: Diagnosis not present

## 2023-06-03 DIAGNOSIS — F32A Depression, unspecified: Secondary | ICD-10-CM | POA: Diagnosis not present

## 2023-07-01 DIAGNOSIS — F419 Anxiety disorder, unspecified: Secondary | ICD-10-CM | POA: Diagnosis not present

## 2023-07-01 DIAGNOSIS — F32A Depression, unspecified: Secondary | ICD-10-CM | POA: Diagnosis not present

## 2023-07-01 DIAGNOSIS — F4321 Adjustment disorder with depressed mood: Secondary | ICD-10-CM | POA: Diagnosis not present

## 2023-07-12 DIAGNOSIS — R0789 Other chest pain: Secondary | ICD-10-CM | POA: Diagnosis not present

## 2023-07-12 DIAGNOSIS — Z6834 Body mass index (BMI) 34.0-34.9, adult: Secondary | ICD-10-CM | POA: Diagnosis not present

## 2023-07-12 DIAGNOSIS — R030 Elevated blood-pressure reading, without diagnosis of hypertension: Secondary | ICD-10-CM | POA: Diagnosis not present

## 2023-09-08 DIAGNOSIS — Z1231 Encounter for screening mammogram for malignant neoplasm of breast: Secondary | ICD-10-CM | POA: Diagnosis not present

## 2023-10-19 DIAGNOSIS — I10 Essential (primary) hypertension: Secondary | ICD-10-CM | POA: Diagnosis not present

## 2023-10-19 DIAGNOSIS — Z6834 Body mass index (BMI) 34.0-34.9, adult: Secondary | ICD-10-CM | POA: Diagnosis not present

## 2023-10-19 DIAGNOSIS — F331 Major depressive disorder, recurrent, moderate: Secondary | ICD-10-CM | POA: Diagnosis not present

## 2023-10-19 DIAGNOSIS — N1831 Chronic kidney disease, stage 3a: Secondary | ICD-10-CM | POA: Diagnosis not present

## 2023-10-19 DIAGNOSIS — H6123 Impacted cerumen, bilateral: Secondary | ICD-10-CM | POA: Diagnosis not present

## 2023-10-19 DIAGNOSIS — M810 Age-related osteoporosis without current pathological fracture: Secondary | ICD-10-CM | POA: Diagnosis not present

## 2023-12-24 DIAGNOSIS — I10 Essential (primary) hypertension: Secondary | ICD-10-CM | POA: Diagnosis not present

## 2023-12-24 DIAGNOSIS — F331 Major depressive disorder, recurrent, moderate: Secondary | ICD-10-CM | POA: Diagnosis not present

## 2023-12-24 DIAGNOSIS — J309 Allergic rhinitis, unspecified: Secondary | ICD-10-CM | POA: Diagnosis not present

## 2024-01-24 DIAGNOSIS — I10 Essential (primary) hypertension: Secondary | ICD-10-CM | POA: Diagnosis not present

## 2024-01-24 DIAGNOSIS — F331 Major depressive disorder, recurrent, moderate: Secondary | ICD-10-CM | POA: Diagnosis not present

## 2024-01-24 DIAGNOSIS — J309 Allergic rhinitis, unspecified: Secondary | ICD-10-CM | POA: Diagnosis not present

## 2024-02-01 ENCOUNTER — Telehealth: Payer: Self-pay

## 2024-02-01 NOTE — Telephone Encounter (Signed)
 So sorry to hear this! Yes, she can use Miralax. I wouldn't be able to address the tailbone issue. Definitely advise seeing PCP or emergency room if severe. Ladonna: you can put her in any slot with me that's available in near future.

## 2024-02-01 NOTE — Telephone Encounter (Signed)
 Pt phoned advising that she had feel hitting her butt bone 2 to 3 weeks ago and it is very painful I advised her to go have evaluated at the ED because it could be a fracture. Also was asked can she use Miralax for her hard stools I advised yes because it is in the notes. Pt was really trying to get in regarding that pain and soreness from hitting her tailbone on the commode. Pt was transferred to the front because she really wanted to speak with you.

## 2024-02-02 NOTE — Telephone Encounter (Signed)
 FYI  Phoned the pt and advised of your note / instructions / recommendations. Pt expressed understanding and advised she has done this before and it heal later on. Declined ED or her PCP for treatment. She was once advised that we could not address the tail bone issue. The pt expressed understanding.

## 2024-02-11 DIAGNOSIS — F330 Major depressive disorder, recurrent, mild: Secondary | ICD-10-CM | POA: Diagnosis not present

## 2024-02-11 DIAGNOSIS — F411 Generalized anxiety disorder: Secondary | ICD-10-CM | POA: Diagnosis not present

## 2024-02-24 DIAGNOSIS — I10 Essential (primary) hypertension: Secondary | ICD-10-CM | POA: Diagnosis not present

## 2024-02-24 DIAGNOSIS — F331 Major depressive disorder, recurrent, moderate: Secondary | ICD-10-CM | POA: Diagnosis not present

## 2024-02-24 DIAGNOSIS — J309 Allergic rhinitis, unspecified: Secondary | ICD-10-CM | POA: Diagnosis not present

## 2024-03-10 DIAGNOSIS — F330 Major depressive disorder, recurrent, mild: Secondary | ICD-10-CM | POA: Diagnosis not present

## 2024-03-10 DIAGNOSIS — F411 Generalized anxiety disorder: Secondary | ICD-10-CM | POA: Diagnosis not present

## 2024-03-16 ENCOUNTER — Ambulatory Visit: Admitting: Gastroenterology

## 2024-03-16 VITALS — BP 130/73 | HR 73 | Temp 98.6°F | Ht <= 58 in | Wt 148.4 lb

## 2024-03-16 DIAGNOSIS — K5900 Constipation, unspecified: Secondary | ICD-10-CM

## 2024-03-16 NOTE — Progress Notes (Signed)
 Gastroenterology Office Note     Primary Care Physician:  Kayla Drivers, MD  Primary Gastroenterologist: Dr. Shaaron    Chief Complaint   Chief Complaint  Patient presents with   Follow-up    Follow up on constipation. Pt states better than it use to be     History of Present Illness   Rachael Morgan is an 83 y.o. delightful female presenting today with a history of constipation, symptomatic hemorrhoids s/p banding, and GERD. She was last seen in Oct 2024 and here for routine follow-up.   She returns today stating that she takes MiraLAX 2 days a week.  She is not taking any supplemental fiber because she feels she does better without this.  She has a bowel movement about every other day.  She denies any straining or feeling unproductive with stool output.  She rarely has to take reflux medicines.  If she does need something she will take Pepto over-the-counter and this is rare.  Her weight is 148 today.  It was previously 156.  She states her appetite waxes and wanes.  She has stopped eating chocolate.  She has been seeing a therapist every other week for anxiety.  She has been dealing with the passing of her husband and this has been difficult for her.  She stays in close contact with her primary care and is followed closely by them for weight checks and labs.   No overt GI bleeding, abdominal pain, nausea, vomiting, dysphagia.      Colonoscopy in 2021 due to positive Cologuard with one 2 mm polyp, one 7 mm polyp, diverticulosis. Tubular adenomas. No repeat colonoscopy due to age    Past Medical History:  Diagnosis Date   Anxiety and depression    Hypercholesterolemia     Past Surgical History:  Procedure Laterality Date   CHOLECYSTECTOMY     COLONOSCOPY  2010   [place]: normal   COLONOSCOPY WITH PROPOFOL  N/A 01/15/2020   due to positive Cologuard with one 2 mm polyp, one 7 mm polyp, diverticulosis. Tubular adenomas.   left breast lumpectomy     benign    POLYPECTOMY  01/15/2020   Procedure: POLYPECTOMY;  Surgeon: Shaaron Lamar HERO, MD;  [facility];  Service: Endoscopy;;    Current Outpatient Medications  Medication Sig Dispense Refill   alendronate (FOSAMAX) 70 MG tablet      ALPRAZolam (XANAX) 0.25 MG tablet Take 0.25 mg by mouth 3 (three) times daily as needed.     atorvastatin (LIPITOR) 20 MG tablet Take 20 mg by mouth daily.     DULoxetine (CYMBALTA) 30 MG capsule Take 30 mg by mouth daily.     fluticasone (FLONASE) 50 MCG/ACT nasal spray Place 1 spray into both nostrils daily.     montelukast (SINGULAIR) 10 MG tablet Take 10 mg by mouth daily.     propranolol (INDERAL) 10 MG tablet Take 10 mg by mouth daily.     docusate sodium (COLACE) 100 MG capsule Take 100 mg by mouth daily as needed for mild constipation. (Patient not taking: Reported on 03/16/2024)     Vitamin D, Ergocalciferol, (DRISDOL) 1.25 MG (50000 UNIT) CAPS capsule Take 50,000 Units by mouth once a week. (Patient not taking: Reported on 03/16/2024)     Wheat Dextrin (BENEFIBER DRINK MIX PO) Take 4 g by mouth daily. (Patient not taking: Reported on 03/16/2024)     No current facility-administered medications for this visit.    Allergies as of  03/16/2024 - Review Complete 03/16/2024  Allergen Reaction Noted   Sulfa antibiotics Nausea Only 09/24/2017    Family History  Problem Relation Age of Onset   Colon cancer Maternal Grandmother    Colon polyps Neg Hx     Social History   Socioeconomic History   Marital status: Widowed    Spouse name: Not on file   Number of children: Not on file   Years of education: Not on file   Highest education level: Not on file  Occupational History   Not on file  Tobacco Use   Smoking status: Never   Smokeless tobacco: Never  Substance and Sexual Activity   Alcohol  use: No   Drug use: No   Sexual activity: Not Currently  Other Topics Concern   Not on file  Social History Narrative   Not on file   Social Drivers  of Health   Financial Resource Strain: Not on file  Food Insecurity: Not on file  Transportation Needs: Not on file  Physical Activity: Not on file  Stress: Not on file  Social Connections: Not on file  Intimate Partner Violence: Not on file     Review of Systems   Gen: Denies any fever, chills, fatigue, weight loss, lack of appetite.  CV: Denies chest pain, heart palpitations, peripheral edema, syncope.  Resp: Denies shortness of breath at rest or with exertion. Denies wheezing or cough.  GI: Denies dysphagia or odynophagia. Denies jaundice, hematemesis, fecal incontinence. GU : Denies urinary burning, urinary frequency, urinary hesitancy MS: Denies joint pain, muscle weakness, cramps, or limitation of movement.  Derm: Denies rash, itching, dry skin Psych: Denies depression, anxiety, memory loss, and confusion Heme: Denies bruising, bleeding, and enlarged lymph nodes.   Physical Exam   BP 130/73   Pulse 73   Temp 98.6 F (37 C)   Ht 4' 8 (1.422 m)   Wt 148 lb 6.4 oz (67.3 kg)   BMI 33.27 kg/m  General:   Alert and oriented. Pleasant and cooperative. Well-nourished and well-developed.  Head:  Normocephalic and atraumatic. Eyes:  Without icterus Abdomen:  +BS, soft, non-tender and non-distended. No HSM noted. No guarding or rebound. No masses appreciated.  Rectal:  Deferred  Msk:  Symmetrical without gross deformities. Normal posture. Extremities:  Without edema. Neurologic:  Alert and  oriented x4;  grossly normal neurologically. Skin:  Intact without significant lesions or rashes. Psych:  Alert and cooperative. Normal mood and affect.   Assessment/Plan:    Rachael Morgan is a delightful 83 y.o. female presenting today for routine follow-up of constipation.  She has also had symptomatic hemorrhoids in the past with excellent success status post banding.  She is doing quite well with her bowel regimen, which includes MiraLAX as needed.  She is not on any  prescriptive agents by us .  I do note that she has lost 8 pounds since she was last seen in October 2024.  She endorses significant stress and anxiety and has been seeing a therapist every other week.  She is also been dealing with grief since her husband has passed away.  She has no concerning lower GI signs or symptoms, and she has no dysphagia, abdominal pain, nausea, vomiting.  She is being followed closely by her primary care provider, we can just see her back as needed.  I do not think that weight loss is related to any GI etiology at this time.  However if she continues to lose weight, she will  reach back out to us .  We can see her back as needed.  No surveillance colonoscopy due to age.    Therisa MICAEL Stager, PhD, ANP-BC [facility]

## 2024-03-16 NOTE — Patient Instructions (Signed)
 I am glad you are doing well!  If you continue to lose weight, please reach out to us .   We can see you back as needed.  It's always a delight to see you!  I enjoyed seeing you again today! I value our relationship and want to provide genuine, compassionate, and quality care. You may receive a survey regarding your visit with me, and I welcome your feedback! Thanks so much for taking the time to complete this. I look forward to seeing you again.      Therisa MICAEL Stager, PhD, ANP-BC [facility]

## 2024-03-23 DIAGNOSIS — F331 Major depressive disorder, recurrent, moderate: Secondary | ICD-10-CM | POA: Diagnosis not present

## 2024-03-23 DIAGNOSIS — I10 Essential (primary) hypertension: Secondary | ICD-10-CM | POA: Diagnosis not present

## 2024-03-23 DIAGNOSIS — J309 Allergic rhinitis, unspecified: Secondary | ICD-10-CM | POA: Diagnosis not present

## 2024-04-07 DIAGNOSIS — F411 Generalized anxiety disorder: Secondary | ICD-10-CM | POA: Diagnosis not present

## 2024-04-07 DIAGNOSIS — F330 Major depressive disorder, recurrent, mild: Secondary | ICD-10-CM | POA: Diagnosis not present

## 2024-04-11 ENCOUNTER — Encounter: Payer: Self-pay | Admitting: Gastroenterology

## 2024-04-25 DIAGNOSIS — J309 Allergic rhinitis, unspecified: Secondary | ICD-10-CM | POA: Diagnosis not present

## 2024-04-25 DIAGNOSIS — F331 Major depressive disorder, recurrent, moderate: Secondary | ICD-10-CM | POA: Diagnosis not present

## 2024-04-25 DIAGNOSIS — I10 Essential (primary) hypertension: Secondary | ICD-10-CM | POA: Diagnosis not present

## 2024-05-17 DIAGNOSIS — F330 Major depressive disorder, recurrent, mild: Secondary | ICD-10-CM | POA: Diagnosis not present

## 2024-05-17 DIAGNOSIS — F411 Generalized anxiety disorder: Secondary | ICD-10-CM | POA: Diagnosis not present

## 2024-05-18 DIAGNOSIS — I10 Essential (primary) hypertension: Secondary | ICD-10-CM | POA: Diagnosis not present

## 2024-05-18 DIAGNOSIS — R5383 Other fatigue: Secondary | ICD-10-CM | POA: Diagnosis not present

## 2024-05-18 DIAGNOSIS — K760 Fatty (change of) liver, not elsewhere classified: Secondary | ICD-10-CM | POA: Diagnosis not present

## 2024-05-18 DIAGNOSIS — Z0001 Encounter for general adult medical examination with abnormal findings: Secondary | ICD-10-CM | POA: Diagnosis not present

## 2024-05-18 DIAGNOSIS — D559 Anemia due to enzyme disorder, unspecified: Secondary | ICD-10-CM | POA: Diagnosis not present

## 2024-05-18 DIAGNOSIS — Z1329 Encounter for screening for other suspected endocrine disorder: Secondary | ICD-10-CM | POA: Diagnosis not present

## 2024-05-18 DIAGNOSIS — E559 Vitamin D deficiency, unspecified: Secondary | ICD-10-CM | POA: Diagnosis not present

## 2024-05-18 DIAGNOSIS — N1831 Chronic kidney disease, stage 3a: Secondary | ICD-10-CM | POA: Diagnosis not present

## 2024-05-18 DIAGNOSIS — E7849 Other hyperlipidemia: Secondary | ICD-10-CM | POA: Diagnosis not present

## 2024-05-23 DIAGNOSIS — N1831 Chronic kidney disease, stage 3a: Secondary | ICD-10-CM | POA: Diagnosis not present

## 2024-05-23 DIAGNOSIS — Z1389 Encounter for screening for other disorder: Secondary | ICD-10-CM | POA: Diagnosis not present

## 2024-05-23 DIAGNOSIS — Z1331 Encounter for screening for depression: Secondary | ICD-10-CM | POA: Diagnosis not present

## 2024-05-23 DIAGNOSIS — M810 Age-related osteoporosis without current pathological fracture: Secondary | ICD-10-CM | POA: Diagnosis not present

## 2024-05-23 DIAGNOSIS — Z0001 Encounter for general adult medical examination with abnormal findings: Secondary | ICD-10-CM | POA: Diagnosis not present

## 2024-05-23 DIAGNOSIS — F331 Major depressive disorder, recurrent, moderate: Secondary | ICD-10-CM | POA: Diagnosis not present

## 2024-05-23 DIAGNOSIS — F411 Generalized anxiety disorder: Secondary | ICD-10-CM | POA: Diagnosis not present

## 2024-05-23 DIAGNOSIS — I10 Essential (primary) hypertension: Secondary | ICD-10-CM | POA: Diagnosis not present

## 2024-05-23 DIAGNOSIS — Z23 Encounter for immunization: Secondary | ICD-10-CM | POA: Diagnosis not present

## 2024-05-26 DIAGNOSIS — F331 Major depressive disorder, recurrent, moderate: Secondary | ICD-10-CM | POA: Diagnosis not present

## 2024-05-26 DIAGNOSIS — I10 Essential (primary) hypertension: Secondary | ICD-10-CM | POA: Diagnosis not present

## 2024-05-26 DIAGNOSIS — J309 Allergic rhinitis, unspecified: Secondary | ICD-10-CM | POA: Diagnosis not present
